# Patient Record
Sex: Male | Born: 1990
Health system: Southern US, Community
[De-identification: ages and names within clinical notes are randomized; demographics above are authoritative.]

## PROBLEM LIST (undated history)

## (undated) DIAGNOSIS — F32A Depression, unspecified: Secondary | ICD-10-CM

## (undated) HISTORY — DX: Depression, unspecified: F32.A

---

## 2016-02-13 DIAGNOSIS — C4491 Basal cell carcinoma of skin, unspecified: Secondary | ICD-10-CM

## 2016-02-13 HISTORY — DX: Basal cell carcinoma of skin, unspecified: C44.91

## 2016-02-13 HISTORY — PX: SKIN CANCER EXCISION: SHX779

## 2017-02-12 DIAGNOSIS — Z9889 Other specified postprocedural states: Secondary | ICD-10-CM

## 2017-02-12 DIAGNOSIS — K921 Melena: Secondary | ICD-10-CM

## 2017-02-12 HISTORY — DX: Melena: K92.1

## 2017-02-12 HISTORY — PX: COLONOSCOPY: SHX174

## 2017-02-12 HISTORY — PX: UPPER GI ENDOSCOPY: SHX6162

## 2017-02-12 HISTORY — DX: Other specified postprocedural states: Z98.890

## 2017-03-27 DIAGNOSIS — C44319 Basal cell carcinoma of skin of other parts of face: Secondary | ICD-10-CM | POA: Diagnosis not present

## 2017-08-01 DIAGNOSIS — L089 Local infection of the skin and subcutaneous tissue, unspecified: Secondary | ICD-10-CM | POA: Diagnosis not present

## 2017-11-29 DIAGNOSIS — K625 Hemorrhage of anus and rectum: Secondary | ICD-10-CM | POA: Diagnosis not present

## 2017-12-09 DIAGNOSIS — R1084 Generalized abdominal pain: Secondary | ICD-10-CM | POA: Diagnosis not present

## 2017-12-09 DIAGNOSIS — K921 Melena: Secondary | ICD-10-CM | POA: Diagnosis not present

## 2017-12-09 DIAGNOSIS — R197 Diarrhea, unspecified: Secondary | ICD-10-CM | POA: Diagnosis not present

## 2017-12-10 DIAGNOSIS — R109 Unspecified abdominal pain: Secondary | ICD-10-CM | POA: Diagnosis not present

## 2017-12-10 DIAGNOSIS — K921 Melena: Secondary | ICD-10-CM | POA: Diagnosis not present

## 2017-12-10 DIAGNOSIS — R197 Diarrhea, unspecified: Secondary | ICD-10-CM | POA: Diagnosis not present

## 2018-02-17 DIAGNOSIS — K921 Melena: Secondary | ICD-10-CM | POA: Diagnosis not present

## 2018-02-17 DIAGNOSIS — K648 Other hemorrhoids: Secondary | ICD-10-CM | POA: Diagnosis not present

## 2018-04-28 DIAGNOSIS — L819 Disorder of pigmentation, unspecified: Secondary | ICD-10-CM | POA: Diagnosis not present

## 2018-04-28 DIAGNOSIS — Z85828 Personal history of other malignant neoplasm of skin: Secondary | ICD-10-CM | POA: Diagnosis not present

## 2018-04-28 DIAGNOSIS — D225 Melanocytic nevi of trunk: Secondary | ICD-10-CM | POA: Diagnosis not present

## 2018-08-29 DIAGNOSIS — K219 Gastro-esophageal reflux disease without esophagitis: Secondary | ICD-10-CM | POA: Diagnosis not present

## 2018-08-29 DIAGNOSIS — R1013 Epigastric pain: Secondary | ICD-10-CM | POA: Diagnosis not present

## 2018-08-29 DIAGNOSIS — K921 Melena: Secondary | ICD-10-CM | POA: Diagnosis not present

## 2018-09-03 DIAGNOSIS — K219 Gastro-esophageal reflux disease without esophagitis: Secondary | ICD-10-CM | POA: Diagnosis not present

## 2018-09-03 DIAGNOSIS — K3189 Other diseases of stomach and duodenum: Secondary | ICD-10-CM | POA: Diagnosis not present

## 2018-12-04 DIAGNOSIS — M25511 Pain in right shoulder: Secondary | ICD-10-CM | POA: Diagnosis not present

## 2019-01-02 DIAGNOSIS — M549 Dorsalgia, unspecified: Secondary | ICD-10-CM | POA: Diagnosis not present

## 2019-01-22 ENCOUNTER — Other Ambulatory Visit: Payer: Self-pay

## 2019-01-22 ENCOUNTER — Encounter: Payer: Self-pay | Admitting: Family Medicine

## 2019-01-22 ENCOUNTER — Ambulatory Visit: Payer: BC Managed Care – PPO | Admitting: Family Medicine

## 2019-01-22 VITALS — BP 142/85 | HR 79 | Temp 98.7°F | Resp 16 | Ht 70.0 in | Wt 235.2 lb

## 2019-01-22 DIAGNOSIS — Z0000 Encounter for general adult medical examination without abnormal findings: Secondary | ICD-10-CM

## 2019-01-22 DIAGNOSIS — E669 Obesity, unspecified: Secondary | ICD-10-CM

## 2019-01-22 DIAGNOSIS — M898X1 Other specified disorders of bone, shoulder: Secondary | ICD-10-CM | POA: Diagnosis not present

## 2019-01-22 DIAGNOSIS — M25511 Pain in right shoulder: Secondary | ICD-10-CM | POA: Diagnosis not present

## 2019-01-22 NOTE — Patient Instructions (Signed)

## 2019-01-22 NOTE — Progress Notes (Signed)
Office Note 01/22/2019  CC:  Chief Complaint  Patient presents with  . Establish Care    Previous PCP, [facility]    HPI:  Adam Haynes is a 28 y.o. male who is here to establish care. Patient's most recent primary MD: [facility]. Old records were not reviewed prior to or during today's visit except vaccine records.  About 2 mo hx of pain in R shoulder blade, sometimes achiness across upper back.  Some tenderness. UC visits x 2--->musculoskeletal dx.  He was rx meloxicam and it helped a little  Ibup prior to that helped less. NO radiation of the pain up or down or into arms.  Some neck stiffness initially. He is improved but still has some mild shoulder tenderness, occ ache in R upper back. No abd pain, no nausea, no fevers.    Past Medical History:  Diagnosis Date  . Basal cell carcinoma 2018   R zygomatic region of face--Mohs  . Blood in stool 2019   Colonoscopy->int hermorrhoids  . History of esophagogastroduodenoscopy (EGD) 2019   for ?melena but he had recently taken pepto-->all normal.    Past Surgical History:  Procedure Laterality Date  . COLONOSCOPY  2019  . SKIN CANCER EXCISION  2018   BCC face  . UPPER GI ENDOSCOPY  2019    Family History  Problem Relation Age of Onset  . High blood pressure Father   . Heart disease Maternal Grandfather   . Melanoma Paternal Grandmother   . Dementia Paternal Grandfather   . Parkinson's disease Paternal Grandfather     Social History   Socioeconomic History  . Marital status: Single    Spouse name: Not on file  . Number of children: Not on file  . Years of education: Not on file  . Highest education level: Bachelor's degree (e.g., BA, AB, BS)  Occupational History  . Occupation: Document Preservationist  Tobacco Use  . Smoking status: Never Smoker  . Smokeless tobacco: Never Used  Substance and Sexual Activity  . Alcohol use: Yes    Alcohol/week: 1.0 - 2.0 standard drinks    Types: 1 - 2 Cans of beer  per week  . Drug use: Never  . Sexual activity: Not on file  Other Topics Concern  . Not on file  Social History Narrative   Single.  Orig from [place], now in [place].   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   Social Determinants of Health   Financial Resource Strain:   . Difficulty of Paying Living Expenses: Not on file  Food Insecurity:   . Worried About Charity fundraiser in the Last Year: Not on file  . Ran Out of Food in the Last Year: Not on file  Transportation Needs:   . Lack of Transportation (Medical): Not on file  . Lack of Transportation (Non-Medical): Not on file  Physical Activity:   . Days of Exercise per Week: Not on file  . Minutes of Exercise per Session: Not on file  Stress:   . Feeling of Stress : Not on file  Social Connections:   . Frequency of Communication with Friends and Family: Not on file  . Frequency of Social Gatherings with Friends and Family: Not on file  . Attends Religious Services: Not on file  . Active Member of Clubs or Organizations: Not on file  . Attends Archivist Meetings: Not on file  . Marital  Status: Not on file  Intimate Partner Violence:   . Fear of Current or Ex-Partner: Not on file  . Emotionally Abused: Not on file  . Physically Abused: Not on file  . Sexually Abused: Not on file    Outpatient Encounter Medications as of 01/22/2019  Medication Sig  . Omega-3 Fatty Acids (FISH OIL PO) Take by mouth every other day.   No facility-administered encounter medications on file as of 01/22/2019.    No Known Allergies  ROS Review of Systems  Constitutional: Negative for appetite change, chills, fatigue and fever.  HENT: Negative for congestion, dental problem, ear pain and sore throat.   Eyes: Negative for discharge, redness and visual disturbance.  Respiratory: Negative for cough, chest tightness, shortness of breath and wheezing.   Cardiovascular:  Negative for chest pain, palpitations and leg swelling.  Gastrointestinal: Negative for abdominal pain, blood in stool, diarrhea, nausea and vomiting.  Genitourinary: Negative for difficulty urinating, dysuria, flank pain, frequency, hematuria and urgency.  Musculoskeletal: Positive for back pain (see hpi). Negative for arthralgias, joint swelling, myalgias and neck stiffness.  Skin: Negative for pallor and rash.  Neurological: Negative for dizziness, speech difficulty, weakness and headaches.  Hematological: Negative for adenopathy. Does not bruise/bleed easily.  Psychiatric/Behavioral: Negative for confusion and sleep disturbance. The patient is not nervous/anxious.     PE; Blood pressure (!) 142/85, pulse 79, temperature 98.7 F (37.1 C), temperature source Temporal, resp. rate 16, height 5\' 10"  (1.778 m), weight 235 lb 3.2 oz (106.7 kg), SpO2 97 %. Body mass index is 33.75 kg/m.  Gen: Alert, well appearing.  Patient is oriented to person, place, time, and situation. AFFECT: pleasant, lucid thought and speech. ENT: Ears: EACs clear, normal epithelium.  TMs with good light reflex and landmarks bilaterally.  Eyes: no injection, icteris, swelling, or exudate.  EOMI, PERRLA. Nose: no drainage or turbinate edema/swelling.  No injection or focal lesion.  Mouth: lips without lesion/swelling.  Oral mucosa pink and moist.  Dentition intact and without obvious caries or gingival swelling.  Oropharynx without erythema, exudate, or swelling.  Neck: supple/nontender.  ROM fully intact w/out pain or stiffness. No LAD, mass, or TM.  Carotid pulses 2+ bilaterally, without bruits. CV: RRR, no m/r/g.   LUNGS: CTA bilat, nonlabored resps, good aeration in all lung fields. ABD: soft, NT, ND, BS normal.  No hepatospenomegaly or mass.  No bruits. EXT: no clubbing, cyanosis, or edema.  Musculoskeletal: no joint swelling, erythema, warmth, or tenderness.  ROM of all joints intact. Skin - no sores or suspicious  lesions or rashes or color changes Back: no deformity. He has mild focal TTP in 4 separate "trigger point" areas medial and inferior to R scapula.  Pertinent labs:  none  ASSESSMENT AND PLAN:   New pt;  1) R scapula pain, subacute.  Gradually improving of late. He does have myofascial trigger points.  Continue with obs but if pain returns again then he'll return for trigger point injections. Recommended heat, ibup q8h prn with food. Stretch, light workouts.  2) Health maintenance exam: Reviewed age and gender appropriate health maintenance issues (prudent diet, regular exercise, health risks of tobacco and excessive alcohol, use of seatbelts, fire alarms in home, use of sunscreen).  Also reviewed age and gender appropriate health screening as well as vaccine recommendations. Vaccines: reviewed->all UTD.  Flu vaccine: gets this via employer. Labs: CMET and FLP today.  An After Visit Summary was printed and given to the patient.  Return in about 1  year (around 01/22/2020) for annual CPE (fasting): also needs fasting lab appt at his earliest convenience.  Signed:  Crissie Sickles, MD           01/22/2019

## 2019-01-29 ENCOUNTER — Other Ambulatory Visit: Payer: Self-pay

## 2019-01-29 ENCOUNTER — Ambulatory Visit (INDEPENDENT_AMBULATORY_CARE_PROVIDER_SITE_OTHER): Payer: BC Managed Care – PPO | Admitting: Family Medicine

## 2019-01-29 DIAGNOSIS — E669 Obesity, unspecified: Secondary | ICD-10-CM

## 2019-01-29 LAB — COMPREHENSIVE METABOLIC PANEL
ALT: 52 U/L (ref 0–53)
AST: 29 U/L (ref 0–37)
Albumin: 4.8 g/dL (ref 3.5–5.2)
Alkaline Phosphatase: 49 U/L (ref 39–117)
BUN: 13 mg/dL (ref 6–23)
CO2: 30 mEq/L (ref 19–32)
Calcium: 9.7 mg/dL (ref 8.4–10.5)
Chloride: 101 mEq/L (ref 96–112)
Creatinine, Ser: 0.94 mg/dL (ref 0.40–1.50)
GFR: 96.86 mL/min (ref >60.00)
Glucose, Bld: 90 mg/dL (ref 70–99)
Potassium: 4.6 mEq/L (ref 3.5–5.1)
Sodium: 138 mEq/L (ref 135–145)
Total Bilirubin: 0.7 mg/dL (ref 0.2–1.2)
Total Protein: 7.5 g/dL (ref 6.0–8.3)

## 2019-01-29 LAB — LIPID PANEL
Cholesterol: 174 mg/dL (ref 0–200)
HDL: 39 mg/dL — ABNORMAL LOW (ref >39.00)
NonHDL: 135.43
Total CHOL/HDL Ratio: 4
Triglycerides: 322 mg/dL — ABNORMAL HIGH (ref 0.0–149.0)
VLDL: 64.4 mg/dL — ABNORMAL HIGH (ref 0.0–40.0)

## 2019-01-29 LAB — LDL CHOLESTEROL, DIRECT: Direct LDL: 76 mg/dL

## 2019-02-03 ENCOUNTER — Other Ambulatory Visit: Payer: Self-pay

## 2019-02-03 ENCOUNTER — Ambulatory Visit: Payer: BC Managed Care – PPO | Admitting: Family Medicine

## 2019-02-03 ENCOUNTER — Encounter: Payer: Self-pay | Admitting: Family Medicine

## 2019-02-03 VITALS — BP 135/84 | HR 73 | Temp 98.7°F | Resp 16 | Ht 70.0 in | Wt 237.4 lb

## 2019-02-03 DIAGNOSIS — S83001A Unspecified subluxation of right patella, initial encounter: Secondary | ICD-10-CM

## 2019-02-03 NOTE — Progress Notes (Signed)
OFFICE VISIT  02/03/2019   CC:  Chief Complaint  Patient presents with  . Knee Pain    right, x injury 7 days ago   HPI:    Patient is a 28 y.o.  male who presents for knee discomfort and stiffness.   Onset about 7 d/a after getting up from table.  Felt like it did not move like normal, like something went "out of place" briefly. No twist, pop, or pain.   It buckled once.  No swelling.  Initially some discomfort with walking but now wt bearing and walking are fine. Discomfort in area just inf to patella and lateral area of distal thigh, lateral aspect of knee, and lateral part of prox calf. Better last couple days.  No aching or pain in knees prior. No hx of prolonged immobility.  No recent surgical procedure.  No FH of thrombosis.  Past Medical History:  Diagnosis Date  . Basal cell carcinoma 2018   R zygomatic region of face--Mohs  . Blood in stool 2019   Colonoscopy->int hermorrhoids  . History of esophagogastroduodenoscopy (EGD) 2019   for ?melena but he had recently taken pepto-->all normal.    Past Surgical History:  Procedure Laterality Date  . COLONOSCOPY  2019  . SKIN CANCER EXCISION  2018   BCC face  . UPPER GI ENDOSCOPY  2019    Outpatient Medications Prior to Visit  Medication Sig Dispense Refill  . Omega-3 Fatty Acids (FISH OIL PO) Take by mouth every other day.     No facility-administered medications prior to visit.    No Known Allergies  ROS As per HPI  PE: Blood pressure 135/84, pulse 73, temperature 98.7 F (37.1 C), temperature source Temporal, resp. rate 16, height 5\' 10"  ([id number] m), weight 237 lb 6.4 oz (107.7 kg), SpO2 99 %. Gen: Alert, well appearing.  Patient is oriented to person, place, time, and situation. AFFECT: pleasant, lucid thought and speech. R knee w/out deformity, erythema, warmth, or tenderness.  No swelling of knee. Movement of patella medially and laterally shows a bit of increased laxity and mild discomfort on left, but  there is no pain and patellar grind is neg.  Flexion and extension intact w/out pain.  No instability. No pop/click at the IT band region.  Lat collat lig's w/out tenderness or instability. No edema.  No popliteal nodule/mass/tenderness.  No calf asymmetry.  LABS:    Chemistry      Component Value Date/Time   NA 138 01/29/2019 0903   K 4.6 01/29/2019 0903   CL 101 01/29/2019 0903   CO2 30 01/29/2019 0903   BUN 13 01/29/2019 0903   CREATININE 0.94 01/29/2019 0903      Component Value Date/Time   CALCIUM 9.7 01/29/2019 0903   ALKPHOS 49 01/29/2019 0903   AST 29 01/29/2019 0903   ALT 52 01/29/2019 0903   BILITOT 0.7 01/29/2019 0903      IMPRESSION AND PLAN:  Right patellar subluxation, with a bit of residual discomfort/instability. Discussed dx, reassured him. Discussed some strengthening/stretching exercises he can do to minimize chance of recurrence. OK to wear soft knee sleeve for now until it feels back to normal. Signs/symptoms to call or return for were reviewed and pt expressed understanding.  An After Visit Summary was printed and given to the patient.  FOLLOW UP: No follow-ups on file.  Signed:  Crissie Sickles, MD           02/03/2019

## 2019-04-28 DIAGNOSIS — L110 Acquired keratosis follicularis: Secondary | ICD-10-CM | POA: Diagnosis not present

## 2019-04-28 DIAGNOSIS — L819 Disorder of pigmentation, unspecified: Secondary | ICD-10-CM | POA: Diagnosis not present

## 2019-04-28 DIAGNOSIS — L905 Scar conditions and fibrosis of skin: Secondary | ICD-10-CM | POA: Diagnosis not present

## 2019-04-28 DIAGNOSIS — D1801 Hemangioma of skin and subcutaneous tissue: Secondary | ICD-10-CM | POA: Diagnosis not present

## 2019-07-15 ENCOUNTER — Ambulatory Visit: Payer: BC Managed Care – PPO | Admitting: Family Medicine

## 2019-07-15 ENCOUNTER — Other Ambulatory Visit: Payer: Self-pay

## 2019-07-15 ENCOUNTER — Encounter: Payer: Self-pay | Admitting: Family Medicine

## 2019-07-15 VITALS — BP 133/87 | HR 71 | Temp 98.6°F | Resp 16 | Ht 70.0 in | Wt 236.8 lb

## 2019-07-15 DIAGNOSIS — R590 Localized enlarged lymph nodes: Secondary | ICD-10-CM

## 2019-07-15 NOTE — Progress Notes (Signed)
OFFICE VISIT  07/15/2019   CC:  Chief Complaint  Patient presents with  . Possible swollen lymph node    located on right side of neck, first noticed 3 weeks ago   HPI:    Patient is a 29 y.o. Caucasian male who presents for swelling on R side of neck a few cm behind ear. No pain or itching, no f/c/malaise.  Has dandruff but nothing worse than his usual. No hx of similar.  No recent scalp lesion or bite in the region of head/neck.   Past Medical History:  Diagnosis Date  . Basal cell carcinoma 2018   R zygomatic region of face--Mohs  . Blood in stool 2019   Colonoscopy->int hermorrhoids  . History of esophagogastroduodenoscopy (EGD) 2019   for ?melena but he had recently taken pepto-->all normal.    Past Surgical History:  Procedure Laterality Date  . COLONOSCOPY  2019  . SKIN CANCER EXCISION  2018   BCC face  . UPPER GI ENDOSCOPY  2019    Outpatient Medications Prior to Visit  Medication Sig Dispense Refill  . Omega-3 Fatty Acids (FISH OIL PO) Take by mouth every other day.     No facility-administered medications prior to visit.    No Known Allergies  ROS As per HPI  PE: Blood pressure 133/87, pulse 71, temperature 98.6 F (37 C), temperature source Temporal, resp. rate 16, height 5\' 10"  (1.778 m), weight 236 lb 12.8 oz (107.4 kg), SpO2 98 %. Gen: Alert, well appearing.  Patient is oriented to person, place, time, and situation. ENT: Ears: Eyes: no injection, icteris, swelling, or exudate.  EOMI, PERRLA. Nose: no drainage or turbinate edema/swelling.  No injection or focal lesion.  Mouth: lips without lesion/swelling.  Oral mucosa pink and moist.  Dentition intact and without obvious caries or gingival swelling.  Oropharynx without erythema, exudate, or swelling.  Scalp and hair normal.   He has a soft, 1 cm sub Q nodular lesion that is mobile, located at base of skull about 5 cm directly posterior to the angle of R mandible.  No overlying skin changes.  No  tenderness.   No other masses/nodes palpable.  LABS:  none  IMPRESSION AND PLAN:  R base of skull soft tissue nodule, small, feels benign, likely a LN. Discussed option of ultrasound of the area now vs watchful waiting for spontaneous resolution and recheck in office in 1 mo---u/s at that time if not resolved.  He chose watchful waiting. Signs/symptoms to call or return for were reviewed and pt expressed understanding.  An After Visit Summary was printed and given to the patient.  FOLLOW UP: Return in about 4 weeks (around 08/12/2019) for f/u R occip LN.  Signed:  Crissie Sickles, MD           07/15/2019

## 2019-07-21 DIAGNOSIS — H5213 Myopia, bilateral: Secondary | ICD-10-CM | POA: Insufficient documentation

## 2019-07-21 DIAGNOSIS — Z83511 Family history of glaucoma: Secondary | ICD-10-CM | POA: Diagnosis not present

## 2019-07-21 DIAGNOSIS — H43393 Other vitreous opacities, bilateral: Secondary | ICD-10-CM | POA: Diagnosis not present

## 2019-08-21 ENCOUNTER — Other Ambulatory Visit (HOSPITAL_COMMUNITY)
Admission: RE | Admit: 2019-08-21 | Discharge: 2019-08-21 | Disposition: A | Discharge status: Home / Self Care | Payer: BC Managed Care – PPO | Source: Ambulatory Visit | Attending: Family Medicine | Admitting: Family Medicine

## 2019-08-21 ENCOUNTER — Encounter: Payer: Self-pay | Admitting: Family Medicine

## 2019-08-21 ENCOUNTER — Ambulatory Visit (INDEPENDENT_AMBULATORY_CARE_PROVIDER_SITE_OTHER): Payer: BC Managed Care – PPO | Admitting: Family Medicine

## 2019-08-21 ENCOUNTER — Other Ambulatory Visit: Payer: Self-pay

## 2019-08-21 VITALS — BP 145/84 | HR 52 | Temp 98.7°F | Resp 17 | Ht 70.0 in | Wt 233.2 lb

## 2019-08-21 DIAGNOSIS — Z7251 High risk heterosexual behavior: Secondary | ICD-10-CM

## 2019-08-21 DIAGNOSIS — R350 Frequency of micturition: Secondary | ICD-10-CM

## 2019-08-21 DIAGNOSIS — R102 Pelvic and perineal pain: Secondary | ICD-10-CM

## 2019-08-21 DIAGNOSIS — R221 Localized swelling, mass and lump, neck: Secondary | ICD-10-CM | POA: Diagnosis not present

## 2019-08-21 DIAGNOSIS — F419 Anxiety disorder, unspecified: Secondary | ICD-10-CM

## 2019-08-21 LAB — POCT URINALYSIS DIPSTICK
Bilirubin, UA: NEGATIVE
Blood, UA: NEGATIVE
Glucose, UA: NEGATIVE
Ketones, UA: NEGATIVE
Leukocytes, UA: NEGATIVE
Nitrite, UA: NEGATIVE
Protein, UA: NEGATIVE
Spec Grav, UA: 1.015
Urobilinogen, UA: 0.2 U/dL
pH, UA: 6.5

## 2019-08-21 NOTE — Progress Notes (Signed)
OFFICE VISIT  08/21/2019   CC:  Chief Complaint  Patient presents with  . Follow-up    Pt has had burning with urination with pain under rib cage.    HPI:    Patient is a 29 y.o. Caucasian male who presents for 5 week f/u soft tissue nodule over the base of skull on R side.   A/P as of last visit: "R base of skull soft tissue nodule, small, feels benign, likely a LN. Discussed option of ultrasound of the area now vs watchful waiting for spontaneous resolution and recheck in office in 1 mo---u/s at that time if not resolved.  He chose watchful waiting."  INTERIM HX: Feeling nervous.  Says the nodule does not feel nearly as pronounced it was. A few days ago felt any achiness in generalized chest area and under ribs. Mild, constant, like muscle achiness per pt, gone today. No SOB, diaphoresis.  Recently (onset about a week ago) has had some intermittent mild discomfort in lower midline abd/suprapubic region when urinating.  Otherwise no discomfort.   No burning with urination.  No straining.  No blood in urine.  Some increased urgency/frequency but he admits he drinks a lot of fluids. Has had unprotected sex with a girl in the last 2 wks.  He has no hx of STD. She has not complained of any symptoms.  Pt states the male partner had a monogamous boyfriend for 2 yrs prior. Pt can't be sure but thinks he may have seen something in his urine on one occasion that resembled pus.  He admits he's nervous and may be hyper-focusing on things physically b/c of the nodule on neck lately.    ROS: no fevers,no SOB, no wheezing, no cough, no dizziness, no HAs, no rashes, no melena/hematochezia. No polydipsia.  No arthralgias.  No focal weakness, paresthesias, or tremors.  No acute vision or hearing abnormalities. No n/v/d. No palpitations.      Past Medical History:  Diagnosis Date  . Basal cell carcinoma 2018   R zygomatic region of face--Mohs  . Blood in stool 2019   Colonoscopy->int  hermorrhoids  . History of esophagogastroduodenoscopy (EGD) 2019   for ?melena but he had recently taken pepto-->all normal.    Past Surgical History:  Procedure Laterality Date  . COLONOSCOPY  2019  . SKIN CANCER EXCISION  2018   BCC face  . UPPER GI ENDOSCOPY  2019    Outpatient Medications Prior to Visit  Medication Sig Dispense Refill  . Omega-3 Fatty Acids (FISH OIL PO) Take by mouth every other day.     No facility-administered medications prior to visit.    No Known Allergies  ROS As per HPI  PE: Blood pressure (!) 145/84, pulse (!) 52, temperature 98.7 F (37.1 C), temperature source Temporal, resp. rate 17, height 5\' 10"  (1.778 m), weight 233 lb 4 oz (105.8 kg), SpO2 100 %. Gen: Alert, well appearing.  Patient is oriented to person, place, time, and situation. AFFECT: pleasant, lucid thought and speech. R occipital region NORMAL.  I cannot feel any nodule whatsoever. Remainder of neck also w/out abnormality. CWU:GQBV: no injection, icteris, swelling, or exudate.  EOMI, PERRLA. Mouth: lips without lesion/swelling.  Oral mucosa pink and moist. Oropharynx without erythema, exudate, or swelling.  CV: RRR, no m/r/g.   LUNGS: CTA bilat, nonlabored resps, good aeration in all lung fields. No chest wall TTP ABD: soft/ ND/NT   LABS:  CC UA today: normal   IMPRESSION AND PLAN:  1) R occipital nodule: NOTHING there now. Reassured pt.  2) Chest wall pain: resolved.  Reassured, suspect muscular tension.  3) Suprapubic discomfort, vague urinary complaints, recent unprotected sex. I did obtain a "dirty" urine sample for GC/Chlamydia testing today but chose NOT to empirically treat.    4) Anxiety: He is definitely hyper-focused on himself physiologically, admits this himself. Suspect he has a somatization component to lots of anxiety.  An After Visit Summary was printed and given to the patient.  FOLLOW UP: Return if symptoms worsen or fail to improve.  Signed:   Crissie Sickles, MD           08/21/2019

## 2019-08-25 LAB — URINE CYTOLOGY ANCILLARY ONLY
Chlamydia: NEGATIVE
Comment: NEGATIVE
Comment: NORMAL
Neisseria Gonorrhea: NEGATIVE

## 2019-09-10 DIAGNOSIS — R599 Enlarged lymph nodes, unspecified: Secondary | ICD-10-CM | POA: Diagnosis not present

## 2019-10-27 ENCOUNTER — Telehealth: Payer: Self-pay

## 2019-10-27 DIAGNOSIS — R221 Localized swelling, mass and lump, neck: Secondary | ICD-10-CM

## 2019-10-27 NOTE — Telephone Encounter (Signed)
Patient would like to proceed with recommended [place] of neck mentioned in office visit. He would like to go to the [employer].

## 2019-10-27 NOTE — Telephone Encounter (Signed)
Please advise 

## 2019-10-27 NOTE — Telephone Encounter (Signed)
OK-ordered.

## 2019-10-29 ENCOUNTER — Other Ambulatory Visit: Payer: Self-pay

## 2019-10-29 ENCOUNTER — Ambulatory Visit (INDEPENDENT_AMBULATORY_CARE_PROVIDER_SITE_OTHER): Payer: BC Managed Care – PPO

## 2019-10-29 DIAGNOSIS — R221 Localized swelling, mass and lump, neck: Secondary | ICD-10-CM

## 2019-10-29 NOTE — Telephone Encounter (Signed)
Patient scheduled at [facility] 10/29/19

## 2020-04-27 DIAGNOSIS — Z85828 Personal history of other malignant neoplasm of skin: Secondary | ICD-10-CM | POA: Diagnosis not present

## 2020-04-27 DIAGNOSIS — D229 Melanocytic nevi, unspecified: Secondary | ICD-10-CM | POA: Diagnosis not present

## 2020-04-27 DIAGNOSIS — L905 Scar conditions and fibrosis of skin: Secondary | ICD-10-CM | POA: Diagnosis not present

## 2020-04-27 DIAGNOSIS — L814 Other melanin hyperpigmentation: Secondary | ICD-10-CM | POA: Diagnosis not present

## 2020-05-09 DIAGNOSIS — M791 Myalgia, unspecified site: Secondary | ICD-10-CM | POA: Diagnosis not present

## 2020-05-09 DIAGNOSIS — J069 Acute upper respiratory infection, unspecified: Secondary | ICD-10-CM | POA: Diagnosis not present

## 2020-05-09 DIAGNOSIS — Z20822 Contact with and (suspected) exposure to covid-19: Secondary | ICD-10-CM | POA: Diagnosis not present

## 2020-08-18 ENCOUNTER — Emergency Department
Admission: EM | Admit: 2020-08-18 | Discharge: 2020-08-18 | Disposition: A | Discharge status: Home / Self Care | Payer: BC Managed Care – PPO | Source: Home / Self Care | Attending: Family Medicine | Admitting: Family Medicine

## 2020-08-18 ENCOUNTER — Other Ambulatory Visit: Payer: Self-pay

## 2020-08-18 DIAGNOSIS — U071 COVID-19: Secondary | ICD-10-CM | POA: Diagnosis not present

## 2020-08-18 DIAGNOSIS — S83001S Unspecified subluxation of right patella, sequela: Secondary | ICD-10-CM

## 2020-08-18 LAB — POC SARS CORONAVIRUS 2 AG -  ED: SARS Coronavirus 2 Ag: POSITIVE — AB

## 2020-08-18 NOTE — Discharge Instructions (Addendum)
I have included COVID information Stay home, call if you have questions or problems  I have included information about patellar subluxation.  I will give you the name of the sports medicine doctor recommended.

## 2020-08-18 NOTE — ED Triage Notes (Signed)
Pt presents to Urgent Care with c/o cough, nasal congestion, and body aches x 2 days. Has not taken a COVID test; pt is vaccinated. Pt also wants his R knee evaluated d/t intermittent pain and "popping" x approx 1 year. He is wearing a knee brace.

## 2020-08-18 NOTE — ED Provider Notes (Signed)
[facility]    CSN: [id number] Arrival date & time: 08/18/20  1340      History   Chief Complaint Chief Complaint  Patient presents with   Cough   Nasal Congestion   Knee Pain    Right    HPI Adam Haynes is a 30 y.o. male.   HPI  Patient has had chills.  Body aches.  Nasal congestion and cough.  Its been going on for 2 to 3 days.  No known exposure to COVID.  He has not COVID vaccinated.  No shortness of breath.  Patient also has right knee pain.  This is chronic.  Its been going on for a year.  It started with a patellar subluxation.  He has mentioned it to his primary care doctor, but he tells patient to give it time.  He takes occasional Advil.  Has been wearing a brace for a year.  He has tried to do some exercises.  He wants to know how to get rid of his knee pain  Past Medical History:  Diagnosis Date   Basal cell carcinoma 2018   R zygomatic region of face--Mohs   Blood in stool 2019   Colonoscopy->int hermorrhoids   History of esophagogastroduodenoscopy (EGD) 2019   for ?melena but he had recently taken pepto-->all normal.    Patient Active Problem List   Diagnosis Date Noted   Myopia of both eyes 07/21/2019   Vitreous floater, bilateral 07/21/2019    Past Surgical History:  Procedure Laterality Date   COLONOSCOPY  2019   SKIN CANCER EXCISION  2018   [place] face   UPPER GI ENDOSCOPY  2019       Home Medications    Prior to Admission medications   Medication Sig Start Date End Date Taking? Authorizing Provider  pseudoephedrine (SUDAFED) 30 MG tablet Take 30 mg by mouth every 4 (four) hours as needed for congestion.   Yes [provider]  Omega-3 Fatty Acids (FISH OIL PO) Take by mouth every other day.    [provider]    Family History Family History  Problem Relation Age of Onset   Healthy Mother    High blood pressure Father    Heart disease Maternal Grandfather    Melanoma Paternal Grandmother    Dementia  Paternal Grandfather    Parkinson's disease Paternal Grandfather     Social History Social History   Tobacco Use   Smoking status: Never   Smokeless tobacco: Never  Vaping Use   Vaping Use: Never used  Substance Use Topics   Alcohol use: Yes    Alcohol/week: 2.0 - 3.0 standard drinks    Types: 1 - 2 Cans of beer, 1 Standard drinks or equivalent per week   Drug use: Not Currently     Allergies   Patient has no known allergies.   Review of Systems Review of Systems  See HPI Physical Exam Triage Vital Signs ED Triage Vitals  Enc Vitals Group     BP 08/18/20 1401 122/82     Pulse Rate 08/18/20 1401 (!) 110     Resp 08/18/20 1401 20     Temp 08/18/20 1401 98.7 F (37.1 C)     Temp Source 08/18/20 1401 Oral     SpO2 08/18/20 1401 98 %     Weight 08/18/20 1358 234 lb (106.1 kg)     Height 08/18/20 1358 5\' 10"  (1.778 m)     Head Circumference --  Peak Flow --      Pain Score 08/18/20 1358 1     Pain Loc --      Pain Edu? --      Excl. in [place]? --    No data found.  Updated Vital Signs BP 122/82 (BP Location: Right Arm)   Pulse (!) 110   Temp 98.7 F (37.1 C) (Oral)   Resp 20   Ht 5\' 10"  (1.778 m)   Wt 106.1 kg   SpO2 98%   BMI 33.58 kg/m       Physical Exam Constitutional:      General: He is not in acute distress.    Appearance: He is well-developed and normal weight.  HENT:     Head: Normocephalic and atraumatic.     Right Ear: Tympanic membrane and ear canal normal.     Left Ear: Tympanic membrane and ear canal normal.     Nose: Congestion present.     Mouth/Throat:     Pharynx: Posterior oropharyngeal erythema present.  Eyes:     Conjunctiva/sclera: Conjunctivae normal.     Pupils: Pupils are equal, round, and reactive to light.  Cardiovascular:     Rate and Rhythm: Tachycardia present.     Heart sounds: Normal heart sounds.  Pulmonary:     Effort: Pulmonary effort is normal. No respiratory distress.     Breath sounds: Normal breath  sounds.  Abdominal:     General: There is no distension.     Palpations: Abdomen is soft.  Musculoskeletal:        General: Normal range of motion.     Cervical back: Normal range of motion.     Comments: Right knee in brace.  Skin:    General: Skin is warm and dry.  Neurological:     Mental Status: He is alert.  Psychiatric:        Mood and Affect: Mood normal.        Behavior: Behavior normal.     UC Treatments / Results  Labs (all labs ordered are listed, but only abnormal results are displayed) Labs Reviewed  POC SARS CORONAVIRUS 2 AG -  ED - Abnormal; Notable for the following components:      Result Value   SARS Coronavirus 2 Ag Positive (*)    All other components within normal limits    EKG   Radiology No results found.  Procedures Procedures (including critical care time)  Medications Ordered in UC Medications - No data to display  Initial Impression / Assessment and Plan / UC Course  I have reviewed the triage vital signs and the nursing notes.  Pertinent labs & imaging results that were available during my care of the patient were reviewed by me and considered in my medical decision making (see chart for details).     Discussed home treatment for the COVID.  Information given. Refer to sports medicine for 1 year of knee pain. Final Clinical Impressions(s) / UC Diagnoses   Final diagnoses:  COVID-19  Patellar subluxation, right, sequela     Discharge Instructions      I have included COVID information Stay home, call if you have questions or problems  I have included information about patellar subluxation.  I will give you the name of the sports medicine doctor recommended.   ED Prescriptions   None    PDMP not reviewed this encounter.   Raylene Everts, MD 08/18/20 [phone]

## 2020-10-19 ENCOUNTER — Ambulatory Visit (INDEPENDENT_AMBULATORY_CARE_PROVIDER_SITE_OTHER): Payer: BC Managed Care – PPO | Admitting: Sports Medicine

## 2020-10-19 ENCOUNTER — Other Ambulatory Visit: Payer: Self-pay

## 2020-10-19 ENCOUNTER — Encounter: Payer: Self-pay | Admitting: Sports Medicine

## 2020-10-19 ENCOUNTER — Ambulatory Visit (INDEPENDENT_AMBULATORY_CARE_PROVIDER_SITE_OTHER): Payer: BC Managed Care – PPO

## 2020-10-19 DIAGNOSIS — M222X1 Patellofemoral disorders, right knee: Secondary | ICD-10-CM | POA: Insufficient documentation

## 2020-10-19 DIAGNOSIS — M25562 Pain in left knee: Secondary | ICD-10-CM | POA: Diagnosis not present

## 2020-10-19 DIAGNOSIS — Z09 Encounter for follow-up examination after completed treatment for conditions other than malignant neoplasm: Secondary | ICD-10-CM

## 2020-10-19 DIAGNOSIS — M25761 Osteophyte, right knee: Secondary | ICD-10-CM | POA: Diagnosis not present

## 2020-10-19 MED ORDER — NAPROXEN 500 MG PO TABS: 500.0000 mg | ORAL_TABLET | Freq: Two times a day (BID) | ORAL | 3 refills | Status: AC

## 2020-10-19 NOTE — Progress Notes (Signed)
    Procedures performed today:    None.  Independent interpretation of notes and tests performed by another provider:   None.  Brief History, Exam, Impression, and Recommendations:    Patellofemoral syndrome, right This is a very pleasant 30 year old male, he has a history of right-sided knee pain suspected to be patellar subluxation. This occurred about a year ago, since then he is done okay, he has pain under the patella worse with walking, weightbearing. He denies any patellar instability. On exam he has no effusion, only minimal patellar crepitus, minimally positive painful patellar compression, pain under the patellar facets. We discussed the anatomy and pathophysiology, he will work aggressively in physical therapy, we will add naproxen, x-rays, return to see me in 6 weeks, MRI if no better.    ___________________________________________ Gwen Her. Dianah Field, M.D., ABFM., CAQSM. Primary Care and [place] Instructor of [place] [employer] [facility] [employer]

## 2020-10-19 NOTE — Assessment & Plan Note (Signed)
This is a very pleasant 30 year old male, he has a history of right-sided knee pain suspected to be patellar subluxation. This occurred about a year ago, since then he is done okay, he has pain under the patella worse with walking, weightbearing. He denies any patellar instability. On exam he has no effusion, only minimal patellar crepitus, minimally positive painful patellar compression, pain under the patellar facets. We discussed the anatomy and pathophysiology, he will work aggressively in physical therapy, we will add naproxen, x-rays, return to see me in 6 weeks, MRI if no better.

## 2020-10-28 ENCOUNTER — Ambulatory Visit (INDEPENDENT_AMBULATORY_CARE_PROVIDER_SITE_OTHER): Payer: BC Managed Care – PPO | Admitting: Physical Therapy

## 2020-10-28 ENCOUNTER — Other Ambulatory Visit: Payer: Self-pay

## 2020-10-28 ENCOUNTER — Encounter: Payer: Self-pay | Admitting: Physical Therapy

## 2020-10-28 DIAGNOSIS — R29898 Other symptoms and signs involving the musculoskeletal system: Secondary | ICD-10-CM

## 2020-10-28 DIAGNOSIS — M25561 Pain in right knee: Secondary | ICD-10-CM | POA: Diagnosis not present

## 2020-10-28 DIAGNOSIS — G8929 Other chronic pain: Secondary | ICD-10-CM | POA: Diagnosis not present

## 2020-10-28 DIAGNOSIS — M6281 Muscle weakness (generalized): Secondary | ICD-10-CM | POA: Diagnosis not present

## 2020-10-28 NOTE — Therapy (Signed)
[place], [place], [address] Phone: [phone]   Fax:  [phone]  Physical Therapy Evaluation  Patient Details  Name: Adam Haynes MRN: [id number] Date of Birth: 1990/06/16 Referring Provider (PT): Thekkekandam   Encounter Date: 10/28/2020   PT End of Session - 10/28/20 1014     Visit Number 1    Number of Visits 6    Date for PT Re-Evaluation 12/08/20    PT Start Time 0930    PT Stop Time 1005    PT Time Calculation (min) 35 min    Activity Tolerance Patient tolerated treatment well    Behavior During Therapy [facility] for tasks assessed/performed             Past Medical History:  Diagnosis Date   Basal cell carcinoma 2018   R zygomatic region of face--Mohs   Blood in stool 2019   Colonoscopy->int hermorrhoids   Depression    History of esophagogastroduodenoscopy (EGD) 2019   for ?melena but he had recently taken pepto-->all normal.    Past Surgical History:  Procedure Laterality Date   COLONOSCOPY  2019   SKIN CANCER EXCISION  2018   [place] face   UPPER GI ENDOSCOPY  2019    There were no vitals filed for this visit.    Subjective Assessment - 10/28/20 0933     Subjective Pt had insiduous onset of Rt patella subluxation about 1 year ago and has managed conservatively with a brace. He returned to MD due to Rt knee feeling "off" and MD recommends PT. Pt reports Rt knee feels "uncomfortable" with prolonged standing, running, symptoms ease with rest and stretching. Pt wears brace at all times other than sleep.    How long can you stand comfortably? 6 hours    How long can you walk comfortably? 30 mins to 1 hour hiking    Diagnostic tests x ray shows OA    Patient Stated Goals be able to run and hike with decreased symptoms    Currently in Pain? No/denies                [facility] PT Assessment - 10/28/20 0001       Assessment   Medical Diagnosis Rt patellofemoral syndrome     Referring Provider (PT) Thekkekandam    Onset Date/Surgical Date 10/30/19    Prior Therapy none      Precautions   Precautions None      Balance Screen   Has the patient fallen in the past 6 months No      Prior Function   Level of Independence Independent    Vocation Requirements book preservationist, requires lots of walking and standing    Leisure run, hike      Observation/Other Assessments   Focus on Therapeutic Outcomes (FOTO)  77      Functional Tests   Functional tests Squat;Single leg stance;Step up;Step down      Squat   Comments genu varum on Rt with full squat      Step Up   Comments no difficulty      Step Down   Comments symptoms with eccentric lowering during lateral and forward step down      Single Leg Stance   Comments Rt 15 sec, Lt 30 sec      ROM / Strength   AROM / PROM / Strength AROM;Strength      AROM   Overall AROM Comments bilat knee ROM [facility]  Strength   Strength Assessment Site Hip;Knee    Right/Left Hip Right;Left    Right Hip Flexion 4+/5    Right Hip Extension 4+/5    Right Hip ABduction 4+/5    Left Hip Flexion 4+/5    Left Hip Extension 4+/5    Left Hip ABduction 4+/5    Right/Left Knee Right;Left    Right Knee Flexion 4+/5    Right Knee Extension 4+/5    Left Knee Flexion 4+/5    Left Knee Extension 4+/5      Palpation   Patella mobility WFL bilat    Palpation comment mild TTP distal anterior Rt knee                        Objective measurements completed on examination: See above findings.       [place] Adult PT Treatment/Exercise - 10/28/20 0001       Exercises   Exercises Knee/Hip      Knee/Hip Exercises: Stretches   Passive Hamstring Stretch Both;2 reps;30 seconds      Knee/Hip Exercises: Standing   Step Down Right;2 sets;5 reps;Hand Hold: 0;Step Height: 6"    Step Down Limitations laterally and forward    Functional Squat 10 reps    Functional Squat Limitations on bosu    Other  Standing Knee Exercises sidestep with green TB around feet x 10' bilat                     PT Education - 10/28/20 1008     Education Details PT POC and goals, HEP    Person(s) Educated Patient    Methods Explanation;Demonstration;Handout    Comprehension Returned demonstration;Verbalized understanding                 PT Long Term Goals - 10/28/20 1132       PT LONG TERM GOAL #1   Title Pt will be independent with HEP    Time 6    Period Weeks    Status New    Target Date 12/09/20      PT LONG TERM GOAL #2   Title Pt will improve FOTO to >= 84 to demo improved functional mobility    Time 6    Period Weeks    Status New    Target Date 12/09/20      PT LONG TERM GOAL #3   Title Pt will improve Rt LE SLS to >= 30 seconds to demo improved balance    Time 6    Period Weeks    Status New    Target Date 12/09/20      PT LONG TERM GOAL #4   Title Pt will run x 10 minutes with Rt knee pain <= 1/10    Time 6    Period Weeks    Status New    Target Date 12/09/20                    Plan - 10/28/20 1015     Clinical Impression Statement Pt is a 30 y/o male referred for Rt patellofemoral syndrome. Pt presents with Rt knee pain, decreased eccentric strength and control, decreased balance and will benefit from skilled PT to address deficits and improve functional mobility    Personal Factors and Comorbidities Time since onset of injury/illness/exacerbation    Examination-Activity Limitations Stairs;Locomotion Level    Examination-Participation Restrictions Occupation;Community Activity    Stability/Clinical Decision Making  Stable/Uncomplicated    Clinical Decision Making Low    Rehab Potential Good    PT Frequency 1x / week    PT Duration 6 weeks    PT Treatment/Interventions Cryotherapy;Electrical Stimulation;Iontophoresis '4mg'$ /ml Dexamethasone;Moist Heat;Stair training;Gait training;Balance training;Neuromuscular re-education;Therapeutic  exercise;Therapeutic activities;Dry needling;Taping;Patient/family education;Manual techniques;Vasopneumatic Device    PT Next Visit Plan assess HEP and progress knee strength and balance    PT Home Exercise Plan [email or web address]    Consulted and Agree with Plan of Care Patient             Patient will benefit from skilled therapeutic intervention in order to improve the following deficits and impairments:  Pain, Decreased strength, Decreased activity tolerance, Decreased balance, Decreased endurance  Visit Diagnosis: Chronic pain of right knee - Plan: PT plan of care cert/re-cert  Muscle weakness (generalized) - Plan: PT plan of care cert/re-cert  Other symptoms and signs involving the musculoskeletal system - Plan: PT plan of care cert/re-cert     Problem List Patient Active Problem List   Diagnosis Date Noted   Patellofemoral syndrome, right 10/19/2020   Myopia of both eyes 07/21/2019   Vitreous floater, bilateral 07/21/2019    Emmarie Sannes, PT 10/28/2020, 11:38 AM  [facility] [place], [place], [address] Phone: [phone]   Fax:  [phone]  Name: Adam Haynes MRN: [id number] Date of Birth: Aug 10, 1990

## 2020-10-28 NOTE — Patient Instructions (Signed)
Access Code: [email or web address] URL: [email or web address] Date: 10/28/2020 Prepared by: Isabelle Course  Exercises Hooklying Hamstring Stretch with Strap - 1 x daily - 7 x weekly - 3 sets - 1 reps - 20-30 sec hold Side Stepping with Resistance at Feet - 1 x daily - 7 x weekly - 3 sets - 10 reps Lateral Step Down - 1 x daily - 7 x weekly - 3 sets - 10 reps Forward Step Down - 1 x daily - 7 x weekly - 3 sets - 10 reps Squat on Flat Side of BOSU - 1 x daily - 7 x weekly - 3 sets - 10 reps

## 2020-11-09 ENCOUNTER — Ambulatory Visit: Payer: BC Managed Care – PPO | Admitting: Family Medicine

## 2020-11-09 ENCOUNTER — Encounter: Payer: Self-pay | Admitting: Family Medicine

## 2020-11-09 ENCOUNTER — Other Ambulatory Visit: Payer: Self-pay

## 2020-11-09 VITALS — BP 120/76 | HR 74 | Temp 98.4°F | Ht 70.0 in | Wt 235.0 lb

## 2020-11-09 DIAGNOSIS — I861 Scrotal varices: Secondary | ICD-10-CM

## 2020-11-09 DIAGNOSIS — N5089 Other specified disorders of the male genital organs: Secondary | ICD-10-CM | POA: Diagnosis not present

## 2020-11-09 NOTE — Progress Notes (Signed)
OFFICE VISIT  11/09/2020  CC:  Chief Complaint  Patient presents with   Groin pain    Persistent, occurring for a week and a half to 2 weeks.    HPI:    Patient is a 30 y.o.male who presents for "groin pain, felt lump in that area". Onset about 10d ago, insidious, mild, seems constant but more noticeable when sitting. Feels localized to L scrotum and pubic region on left. No preceding strain/injury, no excessive straining with BMs, no repetitive heavy lifting. He feels a little lump in scrotum on L, notes enlarged vein on L scrotum that he did not notice before.  No bulging in groin.   Past Medical History:  Diagnosis Date   Basal cell carcinoma 2018   R zygomatic region of face--Mohs   Blood in stool 2019   Colonoscopy->int hermorrhoids   Depression    History of esophagogastroduodenoscopy (EGD) 2019   for ?melena but he had recently taken pepto-->all normal.    Past Surgical History:  Procedure Laterality Date   COLONOSCOPY  2019   SKIN CANCER EXCISION  2018   [place] face   UPPER GI ENDOSCOPY  2019    Outpatient Medications Prior to Visit  Medication Sig Dispense Refill   naproxen (NAPROSYN) 500 MG tablet Take 1 tablet (500 mg total) by mouth 2 (two) times daily with a meal. 60 tablet 3   Omega-3 Fatty Acids (FISH OIL PO) Take by mouth every other day.     No facility-administered medications prior to visit.    No Known Allergies  ROS As per HPI  PE: Vitals with BMI 11/09/2020 08/18/2020 08/21/2019  Height 5\' 10"  5\' 10"  5\' 10"   Weight 235 lbs 234 lbs 233 lbs 4 oz  BMI 33.72 38.88 28.00  Systolic 349 179 150  Diastolic 76 82 84  Pulse 74 110 52     Gen: Alert, well appearing.  Patient is oriented to person, place, time, and situation. AFFECT: pleasant, lucid thought and speech. No groin or thigh tenderness.  No pain with resisted flexion, ext, aBduction or aDduction of hip or LL. No pain with hips ROM. No groin mass or protrusion. L testicle with soft and  moveable mass---one anterior to L testicle and one in posterolateral L hemiscrotum.  Nontender.  Testicle size symmetric.  No erythema.  LABS:  none  IMPRESSION AND PLAN:  Left scrotal mass and pain: exam c/w varicocele. Will obtain scrotal u/s for further evaluation.  An After Visit Summary was printed and given to the patient.  FOLLOW UP: Return for as needed.  Signed:  Crissie Sickles, MD           11/09/2020

## 2020-11-10 ENCOUNTER — Ambulatory Visit (INDEPENDENT_AMBULATORY_CARE_PROVIDER_SITE_OTHER): Payer: BC Managed Care – PPO

## 2020-11-10 DIAGNOSIS — I861 Scrotal varices: Secondary | ICD-10-CM | POA: Diagnosis not present

## 2020-11-10 DIAGNOSIS — N5089 Other specified disorders of the male genital organs: Secondary | ICD-10-CM | POA: Diagnosis not present

## 2020-11-10 DIAGNOSIS — N433 Hydrocele, unspecified: Secondary | ICD-10-CM | POA: Diagnosis not present

## 2020-11-10 DIAGNOSIS — N503 Cyst of epididymis: Secondary | ICD-10-CM | POA: Diagnosis not present

## 2020-11-11 ENCOUNTER — Ambulatory Visit (INDEPENDENT_AMBULATORY_CARE_PROVIDER_SITE_OTHER): Payer: BC Managed Care – PPO | Admitting: Physical Therapy

## 2020-11-11 ENCOUNTER — Ambulatory Visit: Payer: BC Managed Care – PPO | Admitting: Family Medicine

## 2020-11-11 ENCOUNTER — Other Ambulatory Visit: Payer: Self-pay

## 2020-11-11 DIAGNOSIS — G8929 Other chronic pain: Secondary | ICD-10-CM

## 2020-11-11 DIAGNOSIS — R29898 Other symptoms and signs involving the musculoskeletal system: Secondary | ICD-10-CM

## 2020-11-11 DIAGNOSIS — M25561 Pain in right knee: Secondary | ICD-10-CM

## 2020-11-11 DIAGNOSIS — M6281 Muscle weakness (generalized): Secondary | ICD-10-CM

## 2020-11-11 NOTE — Therapy (Signed)
[place], [place], [address] Phone: [phone]   Fax:  [phone]  Physical Therapy Treatment  Patient Details  Name: Adam Haynes MRN: [id number] Date of Birth: [birth year] Referring Provider (PT): Thekkekandam   Encounter Date: 11/11/2020   PT End of Session - 11/11/20 0931     Visit Number 2    Number of Visits 6    Date for PT Re-Evaluation 12/08/20    PT Start Time 0845    PT Stop Time 0923    PT Time Calculation (min) 38 min    Activity Tolerance Patient tolerated treatment well    Behavior During Therapy [facility] for tasks assessed/performed             Past Medical History:  Diagnosis Date   Basal cell carcinoma 2018   R zygomatic region of face--Mohs   Blood in stool 2019   Colonoscopy->int hermorrhoids   Depression    History of esophagogastroduodenoscopy (EGD) 2019   for ?melena but he had recently taken pepto-->all normal.    Past Surgical History:  Procedure Laterality Date   COLONOSCOPY  2019   SKIN CANCER EXCISION  2018   [place] face   UPPER GI ENDOSCOPY  2019    There were no vitals filed for this visit.   Subjective Assessment - 11/11/20 0849     Subjective Pt has been able to go to the gym and it is "easier to do stuff". Pt has been wearing the brace at the gym but not during PT exercises    Patient Stated Goals be able to run and hike with decreased symptoms    Currently in Pain? No/denies                [facility] PT Assessment - 11/11/20 0001       Assessment   Medical Diagnosis Rt patellofemoral syndrome    Referring Provider (PT) Thekkekandam    Onset Date/Surgical Date 10/30/19    Prior Therapy none      Single Leg Stance   Comments Rt and Lt 30 sec                           [facility] Adult PT Treatment/Exercise - 11/11/20 0001       Knee/Hip Exercises: Aerobic   Recumbent Bike 5 mins level 3      Knee/Hip Exercises: Machines  for Strengthening   Cybex Leg Press 7 plates x 20 bilat LE, single leg x 20 bilat 5 plates      Knee/Hip Exercises: Standing   SLS with forward reach to chair x 10, then runners SLS x 10 bilat    SLS with Vectors 10 reps bilat    Other Standing Knee Exercises sidestep with green TB around feet x 10' bilat    Other Standing Knee Exercises gait ladder: 1 foot in each, out/out/in/in forward and laterally, hop, high knees      Knee/Hip Exercises: Supine   Bridges 20 reps    Bridges Limitations with LEs on physioball with HS curl                     PT Education - 11/11/20 0931     Education Details updated HEP    Person(s) Educated Patient    Methods Explanation;Demonstration;Handout    Comprehension Returned demonstration;Verbalized understanding  PT Long Term Goals - 10/28/20 1132       PT LONG TERM GOAL #1   Title Pt will be independent with HEP    Time 6    Period Weeks    Status New    Target Date 12/09/20      PT LONG TERM GOAL #2   Title Pt will improve FOTO to >= 84 to demo improved functional mobility    Time 6    Period Weeks    Status New    Target Date 12/09/20      PT LONG TERM GOAL #3   Title Pt will improve Rt LE SLS to >= 30 seconds to demo improved balance    Time 6    Period Weeks    Status New    Target Date 12/09/20      PT LONG TERM GOAL #4   Title Pt will run x 10 minutes with Rt knee pain <= 1/10    Time 6    Period Weeks    Status New    Target Date 12/09/20                   Plan - 11/11/20 0932     Clinical Impression Statement Pt has progressed well with initial HEP and is able to tolerate more work outs at [employer]. Pt continues with deficits in SLS on Rt and with some "discomfort" during hopping and with physioball hamstring curls.  HEP updated and PT encouraged pt to attempt to run/jog before next session to assess progress    PT Next Visit Plan progress HEP, balance, knee strength,  plyometrics    PT Home Exercise Plan [email or web address]    Consulted and Agree with Plan of Care Patient             Patient will benefit from skilled therapeutic intervention in order to improve the following deficits and impairments:     Visit Diagnosis: Chronic pain of right knee  Muscle weakness (generalized)  Other symptoms and signs involving the musculoskeletal system     Problem List Patient Active Problem List   Diagnosis Date Noted   Patellofemoral syndrome, right 10/19/2020   Myopia of both eyes 07/21/2019   Vitreous floater, bilateral 07/21/2019    Jeanmarc Viernes, PT 11/11/2020, 9:40 AM  [facility] [place], [place], [address] Phone: [phone]   Fax:  [phone]  Name: Adam Haynes MRN: [id number] Date of Birth: 12/20/1990

## 2020-11-11 NOTE — Patient Instructions (Signed)
Access Code: [email or web address] URL: [email or web address] Date: 11/11/2020 Prepared by: Isabelle Course  Exercises Hooklying Hamstring Stretch with Strap - 1 x daily - 7 x weekly - 3 sets - 1 reps - 20-30 sec hold Forward T - 1 x daily - 7 x weekly - 3 sets - 10 reps Single Leg Balance with Clock Reach - 1 x daily - 7 x weekly - 3 sets - 10 reps Single Leg Deadlift with Kettlebell - 1 x daily - 7 x weekly - 3 sets - 10 reps Single Leg Running Balance - 1 x daily - 7 x weekly - 3 sets - 10 reps Supine Hamstring Curl on Swiss Ball - 1 x daily - 7 x weekly - 3 sets - 10 reps

## 2020-11-25 ENCOUNTER — Ambulatory Visit (INDEPENDENT_AMBULATORY_CARE_PROVIDER_SITE_OTHER): Payer: BC Managed Care – PPO | Admitting: Rehabilitative and Restorative Service Providers"

## 2020-11-25 ENCOUNTER — Other Ambulatory Visit: Payer: Self-pay

## 2020-11-25 ENCOUNTER — Encounter: Payer: Self-pay | Admitting: Rehabilitative and Restorative Service Providers"

## 2020-11-25 DIAGNOSIS — G8929 Other chronic pain: Secondary | ICD-10-CM | POA: Diagnosis not present

## 2020-11-25 DIAGNOSIS — R29898 Other symptoms and signs involving the musculoskeletal system: Secondary | ICD-10-CM | POA: Diagnosis not present

## 2020-11-25 DIAGNOSIS — M25561 Pain in right knee: Secondary | ICD-10-CM

## 2020-11-25 DIAGNOSIS — M6281 Muscle weakness (generalized): Secondary | ICD-10-CM

## 2020-11-25 NOTE — Patient Instructions (Signed)
Access Code: [email or web address] URL: [email or web address] Date: 11/25/2020 Prepared by: Gillermo Murdoch  Exercises Hooklying Hamstring Stretch with Strap - 1 x daily - 7 x weekly - 3 sets - 1 reps - 20-30 sec hold Forward T - 1 x daily - 7 x weekly - 3 sets - 10 reps Single Leg Balance with Clock Reach - 1 x daily - 7 x weekly - 3 sets - 10 reps Single Leg Deadlift with Kettlebell - 1 x daily - 7 x weekly - 3 sets - 10 reps Single Leg Running Balance - 1 x daily - 7 x weekly - 3 sets - 10 reps Supine Hamstring Curl on Swiss Ball - 1 x daily - 7 x weekly - 3 sets - 10 reps Supine Piriformis Stretch with Leg Straight - 2 x daily - 7 x weekly - 1 sets - 3 reps - 30 sec hold Supine ITB Stretch with Strap - 2 x daily - 7 x weekly - 1 sets - 3 reps - 30 sec hold Wall Squat Hold with Ball - 1 x daily - 7 x weekly - 1-2 sets - 10 reps - 5-10 sec hold

## 2020-11-25 NOTE — Therapy (Signed)
[place], [place], [address] Phone: [phone]   Fax:  [phone]  Physical Therapy Treatment  Patient Details  Name: Adam Haynes MRN: [id number] Date of Birth: 05-25-1990 Referring Provider (PT): Dr Dianah Field   Encounter Date: 11/25/2020   PT End of Session - 11/25/20 0803     Visit Number 3    Number of Visits 6    Date for PT Re-Evaluation 12/08/20    PT Start Time 0801    PT Stop Time 0845    PT Time Calculation (min) 44 min    Activity Tolerance Patient tolerated treatment well             Past Medical History:  Diagnosis Date   Basal cell carcinoma 2018   R zygomatic region of face--Mohs   Blood in stool 2019   Colonoscopy->int hermorrhoids   Depression    History of esophagogastroduodenoscopy (EGD) 2019   for ?melena but he had recently taken pepto-->all normal.    Past Surgical History:  Procedure Laterality Date   COLONOSCOPY  2019   SKIN CANCER EXCISION  2018   [place] face   UPPER GI ENDOSCOPY  2019    There were no vitals filed for this visit.   Subjective Assessment - 11/25/20 0803     Subjective Doing better. Was able to run some with no increase pain. Running after he does his exercises and that seemed to go ok. Noticed some pain Tuesday while standing at work.He was standing in one place for a longer time. Pain at 3-4/10 on Tuesday and lasted about an hour. Responds to stretching.    Currently in Pain? No/denies    Multiple Pain Sites No                [facility] PT Assessment - 11/25/20 0001       Assessment   Medical Diagnosis Rt patellofemoral syndrome    Referring Provider (PT) Dr Dianah Field    Onset Date/Surgical Date 10/30/19    Prior Therapy none      Flexibility   Hamstrings tight end range    ITB tight Rt > Lt    Piriformis tigth Rt > Lt                           [facility] Adult PT Treatment/Exercise - 11/25/20 0001        Knee/Hip Exercises: Stretches   Passive Hamstring Stretch Right;Left;1 rep;30 seconds    Passive Hamstring Stretch Limitations supine with strap    ITB Stretch Right;Left;2 reps;30 seconds    ITB Stretch Limitations supine with strap    Piriformis Stretch Right;Left;3 reps;30 seconds    Piriformis Stretch Limitations supine travell      Knee/Hip Exercises: Aerobic   Elliptical L2 x 5 min      Knee/Hip Exercises: Standing   Wall Squat 10 reps;10 seconds    Wall Squat Limitations ball btn knees to facilitate medial quad/adductors      Manual Therapy   Manual therapy comments instructed in transverse friction massage Rt lateral distal quad at superior patella x 1 min 2 x day for HEP; massage stick Quads/ITB; myofacial ball release posterior Rt hip                     PT Education - 11/25/20 0851     Education Details HEP    Person(s) Educated Patient  Methods Explanation;Demonstration;Verbal cues;Tactile cues;Handout    Comprehension Verbalized understanding;Returned demonstration;Verbal cues required;Tactile cues required                 PT Long Term Goals - 10/28/20 1132       PT LONG TERM GOAL #1   Title Pt will be independent with HEP    Time 6    Period Weeks    Status New    Target Date 12/09/20      PT LONG TERM GOAL #2   Title Pt will improve FOTO to >= 84 to demo improved functional mobility    Time 6    Period Weeks    Status New    Target Date 12/09/20      PT LONG TERM GOAL #3   Title Pt will improve Rt LE SLS to >= 30 seconds to demo improved balance    Time 6    Period Weeks    Status New    Target Date 12/09/20      PT LONG TERM GOAL #4   Title Pt will run x 10 minutes with Rt knee pain <= 1/10    Time 6    Period Weeks    Status New    Target Date 12/09/20                   Plan - 11/25/20 0807     Clinical Impression Statement Progressing wth knee strengthening. Working on exercises consistently. Did  run this week wihtou increased pain. Continues to have some pain with standing in one place for longer periods of time. Continued to work on LE stabilization and strengthening. Note tightness in pirifromis and ITB Rt > Lt. Added stretching to address tightness. Progressing toward stated goals of therapy.    Rehab Potential Good    PT Frequency 1x / week    PT Duration 6 weeks    PT Treatment/Interventions Cryotherapy;Electrical Stimulation;Iontophoresis 4mg /ml Dexamethasone;Moist Heat;Stair training;Gait training;Balance training;Neuromuscular re-education;Therapeutic exercise;Therapeutic activities;Dry needling;Taping;Patient/family education;Manual techniques;Vasopneumatic Device    PT Next Visit Plan progress HEP, balance, knee strength, plyometrics    PT Home Exercise Plan [email or web address]    Consulted and Agree with Plan of Care Patient             Patient will benefit from skilled therapeutic intervention in order to improve the following deficits and impairments:     Visit Diagnosis: Chronic pain of right knee  Muscle weakness (generalized)  Other symptoms and signs involving the musculoskeletal system     Problem List Patient Active Problem List   Diagnosis Date Noted   Patellofemoral syndrome, right 10/19/2020   Myopia of both eyes 07/21/2019   Vitreous floater, bilateral 07/21/2019    Rashidi Loh Nilda Simmer, PT, MPH  11/25/2020, 8:55 AM  [facility] [place], [place], [address] Phone: [phone]   Fax:  [phone]  Name: Adam Haynes MRN: [id number] Date of Birth: 04/11/1990

## 2020-11-30 ENCOUNTER — Ambulatory Visit (INDEPENDENT_AMBULATORY_CARE_PROVIDER_SITE_OTHER): Payer: BC Managed Care – PPO | Admitting: Sports Medicine

## 2020-11-30 DIAGNOSIS — M222X1 Patellofemoral disorders, right knee: Secondary | ICD-10-CM

## 2020-11-30 NOTE — Assessment & Plan Note (Addendum)
Adam Haynes has some degenerative changes on his x-rays, patellofemoral symptoms, physical therapy has worked well, he is doing really well, return as needed.

## 2020-11-30 NOTE — Progress Notes (Signed)
    Procedures performed today:    None.  Independent interpretation of notes and tests performed by another provider:   None.  Brief History, Exam, Impression, and Recommendations:    Patellofemoral syndrome, right [place] has some degenerative changes on his x-rays, patellofemoral symptoms, physical therapy has worked well, he is doing really well, return as needed.    ___________________________________________ Gwen Her. Dianah Field, M.D., ABFM., CAQSM. Primary Care and [place] Instructor of [place] [employer] [facility] [employer] [facility]

## 2020-12-09 ENCOUNTER — Ambulatory Visit (INDEPENDENT_AMBULATORY_CARE_PROVIDER_SITE_OTHER): Payer: BC Managed Care – PPO | Admitting: Physical Therapy

## 2020-12-09 ENCOUNTER — Other Ambulatory Visit: Payer: Self-pay

## 2020-12-09 DIAGNOSIS — G8929 Other chronic pain: Secondary | ICD-10-CM | POA: Diagnosis not present

## 2020-12-09 DIAGNOSIS — M6281 Muscle weakness (generalized): Secondary | ICD-10-CM

## 2020-12-09 DIAGNOSIS — R29898 Other symptoms and signs involving the musculoskeletal system: Secondary | ICD-10-CM | POA: Diagnosis not present

## 2020-12-09 DIAGNOSIS — M25561 Pain in right knee: Secondary | ICD-10-CM | POA: Diagnosis not present

## 2020-12-09 NOTE — Therapy (Signed)
[place] [place], [place], [address] Phone: [phone]   Fax:  [phone]  Physical Therapy Treatment and Discharge  Patient Details  Name: Adam Haynes MRN: [id number] Date of Birth: 08/14/1990 Referring Provider (PT): Dr Dianah Field   Encounter Date: 12/09/2020   PT End of Session - 12/09/20 0844     Visit Number 4    Number of Visits 6    Date for PT Re-Evaluation 12/08/20    PT Start Time 0800    PT Stop Time 0838    PT Time Calculation (min) 38 min    Activity Tolerance Patient tolerated treatment well    Behavior During Therapy [facility] for tasks assessed/performed             Past Medical History:  Diagnosis Date   Basal cell carcinoma 2018   R zygomatic region of face--Mohs   Blood in stool 2019   Colonoscopy->int hermorrhoids   Depression    History of esophagogastroduodenoscopy (EGD) 2019   for ?melena but he had recently taken pepto-->all normal.    Past Surgical History:  Procedure Laterality Date   COLONOSCOPY  2019   SKIN CANCER EXCISION  2018   [place] face   UPPER GI ENDOSCOPY  2019    There were no vitals filed for this visit.   Subjective Assessment - 12/09/20 0802     Subjective Pt states that he has been able to run without pain. He says that for the past week he has not had increased pain with standing still for long periods at work    Patient Stated Goals be able to run and hike with decreased symptoms    Currently in Pain? No/denies                [facility] PT Assessment - 12/09/20 0001       Assessment   Medical Diagnosis Rt patellofemoral syndrome    Referring Provider (PT) Dr Dianah Field    Onset Date/Surgical Date 10/30/19      Observation/Other Assessments   Focus on Therapeutic Outcomes (FOTO)  88      Single Leg Stance   Comments > 30 sec bilat                           [facility] Adult PT Treatment/Exercise - 12/09/20 0001        Knee/Hip Exercises: Stretches   Passive Hamstring Stretch Right;Left;1 rep;30 seconds    Passive Hamstring Stretch Limitations supine with strap    ITB Stretch Right;Left;2 reps;30 seconds    ITB Stretch Limitations supine with strap    Piriformis Stretch Right;Left;2 reps;30 seconds    Piriformis Stretch Limitations supine      Knee/Hip Exercises: Aerobic   Recumbent Bike 5 mins level 4      Knee/Hip Exercises: Standing   Wall Squat 10 reps;10 seconds    Wall Squat Limitations ball btn knees to facilitate medial quad/adductors    SLS kettlebell dead lift x 10, 10# KB      Knee/Hip Exercises: Supine   Bridges 20 reps    Bridges Limitations with LEs on physioball with HS curl                     PT Education - 12/09/20 0844     Education Details updated HEP, plan for d/c    Person(s) Educated Patient    Methods Explanation;Demonstration;Handout  Comprehension Verbalized understanding;Returned demonstration                 PT Long Term Goals - 12/09/20 0846       PT LONG TERM GOAL #1   Title Pt will be independent with HEP    Status Achieved      PT LONG TERM GOAL #2   Title Pt will improve FOTO to >= 84 to demo improved functional mobility    Baseline 88    Status Achieved      PT LONG TERM GOAL #3   Title Pt will improve Rt LE SLS to >= 30 seconds to demo improved balance    Status Achieved      PT LONG TERM GOAL #4   Title Pt will run x 10 minutes with Rt knee pain <= 1/10    Status Achieved                   Plan - 12/09/20 0845     Clinical Impression Statement Pt has progressed well and has met his goals. He is able to run and stand at work with little to no pain. He feels comfortable to d/c to HEP at this time    PT Next Visit Plan d/c    PT Home Exercise Plan [email or web address]    Consulted and Agree with Plan of Care Patient             Patient will benefit from skilled therapeutic intervention in order to improve the  following deficits and impairments:     Visit Diagnosis: Chronic pain of right knee  Muscle weakness (generalized)  Other symptoms and signs involving the musculoskeletal system     Problem List Patient Active Problem List   Diagnosis Date Noted   Patellofemoral syndrome, right 10/19/2020   Myopia of both eyes 07/21/2019   Vitreous floater, bilateral 07/21/2019   PHYSICAL THERAPY DISCHARGE SUMMARY  Visits from Start of Care: 4  Current functional level related to goals / functional outcomes: Able to run and stand without pain   Remaining deficits: See above   Education / Equipment: HEP   Patient agrees to discharge. Patient goals were met. Patient is being discharged due to meeting the stated rehab goals.  Marcel Sorter, PT 12/09/2020, 8:48 AM  [facility] [place], [place], [address] Phone: [phone]   Fax:  [phone]  Name: Riaan Toledo MRN: [id number] Date of Birth: [birth year]

## 2020-12-09 NOTE — Patient Instructions (Signed)
Access Code: [email or web address] URL: [email or web address] Date: 12/09/2020 Prepared by: Isabelle Course  Exercises Hooklying Hamstring Stretch with Strap - 1 x daily - 7 x weekly - 3 sets - 1 reps - 20-30 sec hold Supine Piriformis Stretch with Leg Straight - 2 x daily - 7 x weekly - 1 sets - 3 reps - 30 sec hold Supine ITB Stretch with Strap - 2 x daily - 7 x weekly - 1 sets - 3 reps - 30 sec hold Supine Hamstring Curl on Swiss Ball - 1 x daily - 7 x weekly - 3 sets - 10 reps Single Leg Deadlift with Kettlebell - 1 x daily - 7 x weekly - 3 sets - 10 reps Wall Squat Hold with Ball - 1 x daily - 7 x weekly - 1-2 sets - 10 reps - 5-10 sec hold

## 2021-04-27 DIAGNOSIS — Z85828 Personal history of other malignant neoplasm of skin: Secondary | ICD-10-CM | POA: Diagnosis not present

## 2021-04-27 DIAGNOSIS — D225 Melanocytic nevi of trunk: Secondary | ICD-10-CM | POA: Diagnosis not present

## 2021-04-27 DIAGNOSIS — L858 Other specified epidermal thickening: Secondary | ICD-10-CM | POA: Diagnosis not present

## 2021-04-27 DIAGNOSIS — Z08 Encounter for follow-up examination after completed treatment for malignant neoplasm: Secondary | ICD-10-CM | POA: Diagnosis not present

## 2021-12-22 DIAGNOSIS — F331 Major depressive disorder, recurrent, moderate: Secondary | ICD-10-CM | POA: Diagnosis not present

## 2022-02-02 DIAGNOSIS — F331 Major depressive disorder, recurrent, moderate: Secondary | ICD-10-CM | POA: Diagnosis not present

## 2022-03-13 NOTE — Patient Instructions (Signed)
Health Maintenance, Male Adopting a healthy lifestyle and getting preventive care are important in promoting health and wellness. Ask your health care provider about: The right schedule for you to have regular tests and exams. Things you can do on your own to prevent diseases and keep yourself healthy. What should I know about diet, weight, and exercise? Eat a healthy diet  Eat a diet that includes plenty of vegetables, fruits, low-fat dairy products, and lean protein. Do not eat a lot of foods that are high in solid fats, added sugars, or sodium. Maintain a healthy weight Body mass index (BMI) is a measurement that can be used to identify possible weight problems. It estimates body fat based on height and weight. Your health care provider can help determine your BMI and help you achieve or maintain a healthy weight. Get regular exercise Get regular exercise. This is one of the most important things you can do for your health. Most adults should: Exercise for at least 150 minutes each week. The exercise should increase your heart rate and make you sweat (moderate-intensity exercise). Do strengthening exercises at least twice a week. This is in addition to the moderate-intensity exercise. Spend less time sitting. Even light physical activity can be beneficial. Watch cholesterol and blood lipids Have your blood tested for lipids and cholesterol at 30 years of age, then have this test every 5 years. You may need to have your cholesterol levels checked more often if: Your lipid or cholesterol levels are high. You are older than 30 years of age. You are at high risk for heart disease. What should I know about cancer screening? Many types of cancers can be detected early and may often be prevented. Depending on your health history and family history, you may need to have cancer screening at various ages. This may include screening for: Colorectal cancer. Prostate cancer. Skin cancer. Lung  cancer. What should I know about heart disease, diabetes, and high blood pressure? Blood pressure and heart disease High blood pressure causes heart disease and increases the risk of stroke. This is more likely to develop in people who have high blood pressure readings or are overweight. Talk with your health care provider about your target blood pressure readings. Have your blood pressure checked: Every 3-5 years if you are 18-39 years of age. Every year if you are 40 years old or older. If you are between the ages of 65 and 75 and are a current or former smoker, ask your health care provider if you should have a one-time screening for abdominal aortic aneurysm (AAA). Diabetes Have regular diabetes screenings. This checks your fasting blood sugar level. Have the screening done: Once every three years after age 45 if you are at a normal weight and have a low risk for diabetes. More often and at a younger age if you are overweight or have a high risk for diabetes. What should I know about preventing infection? Hepatitis B If you have a higher risk for hepatitis B, you should be screened for this virus. Talk with your health care provider to find out if you are at risk for hepatitis B infection. Hepatitis C Blood testing is recommended for: Everyone born from 1945 through 1965. Anyone with known risk factors for hepatitis C. Sexually transmitted infections (STIs) You should be screened each year for STIs, including gonorrhea and chlamydia, if: You are sexually active and are younger than 30 years of age. You are older than 30 years of age and your   health care provider tells you that you are at risk for this type of infection. Your sexual activity has changed since you were last screened, and you are at increased risk for chlamydia or gonorrhea. Ask your health care provider if you are at risk. Ask your health care provider about whether you are at high risk for HIV. Your health care provider  may recommend a prescription medicine to help prevent HIV infection. If you choose to take medicine to prevent HIV, you should first get tested for HIV. You should then be tested every 3 months for as long as you are taking the medicine. Follow these instructions at home: Alcohol use Do not drink alcohol if your health care provider tells you not to drink. If you drink alcohol: Limit how much you have to 0-2 drinks a day. Know how much alcohol is in your drink. In the [place] one drink equals one 12 oz bottle of beer (355 mL), one 5 oz glass of wine (148 mL), or one 1 oz glass of hard liquor (44 mL). Lifestyle Do not use any products that contain nicotine or tobacco. These products include cigarettes, chewing tobacco, and vaping devices, such as e-cigarettes. If you need help quitting, ask your health care provider. Do not use street drugs. Do not share needles. Ask your health care provider for help if you need support or information about quitting drugs. General instructions Schedule regular health, dental, and eye exams. Stay current with your vaccines. Tell your health care provider if: You often feel depressed. You have ever been abused or do not feel safe at home. Summary Adopting a healthy lifestyle and getting preventive care are important in promoting health and wellness. Follow your health care provider's instructions about healthy diet, exercising, and getting tested or screened for diseases. Follow your health care provider's instructions on monitoring your cholesterol and blood pressure. This information is not intended to replace advice given to you by your health care provider. Make sure you discuss any questions you have with your health care provider. Document Revised: 06/20/2020 Document Reviewed: 06/20/2020 Elsevier Patient Education  2023 Elsevier Inc.  

## 2022-03-14 ENCOUNTER — Encounter: Payer: Self-pay | Admitting: Family Medicine

## 2022-03-14 ENCOUNTER — Ambulatory Visit (INDEPENDENT_AMBULATORY_CARE_PROVIDER_SITE_OTHER): Payer: BC Managed Care – PPO | Admitting: Family Medicine

## 2022-03-14 VITALS — BP 131/82 | HR 66 | Temp 98.9°F | Ht 70.0 in | Wt 250.0 lb

## 2022-03-14 DIAGNOSIS — R599 Enlarged lymph nodes, unspecified: Secondary | ICD-10-CM

## 2022-03-14 DIAGNOSIS — Z0000 Encounter for general adult medical examination without abnormal findings: Secondary | ICD-10-CM | POA: Diagnosis not present

## 2022-03-14 NOTE — Progress Notes (Signed)
Office Note 03/14/2022  CC:  Chief Complaint  Patient presents with   Annual Exam    Pt is fasting. Would like to confirm if there is anything else to do regarding ongoing swollen lymph nodes on his neck. Unchanged since 10/29/19 US soft tissue head & neck    HPI:  Patient is a 32 y.o. male who is here for annual health maintenance exam and concern of neck nodule. Adam Haynes is feeling well. He is seeking some reassurance once again about the lymph node he feels in the back of the neck. It has not changed in size.  It does not hurt.  No other areas of lymph node swelling have been noted.  10/29/19 US soft tissue neck: IMPRESSION: Patient's palpable area of concern involving base of the right-side of the occiput correlates with a benign appearing non pathologically enlarged subcutaneous lymph, nonspecific though presumably reactive in etiology.  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 He hikes regularly for exercise.  He is improving his diet.  Past Medical History:  Diagnosis Date   Basal cell carcinoma 2018   R zygomatic region of face--Mohs   Blood in stool 2019   Colonoscopy->int hermorrhoids   Depression    History of esophagogastroduodenoscopy (EGD) 2019   for ?melena but he had recently taken pepto-->all normal.    Past Surgical History:  Procedure Laterality Date   COLONOSCOPY  2019   SKIN CANCER EXCISION  2018   [place] face   UPPER GI ENDOSCOPY  2019    Family History  Problem Relation Age of Onset   Healthy Mother    High blood pressure Father    Heart disease Maternal Grandfather    Melanoma Paternal Grandmother    Dementia Paternal Grandfather    Parkinson's disease Paternal Grandfather     Social History   Socioeconomic History   Marital status: Single    Spouse name: Not on file   Number of children: Not on file   Years of education: Not on file   Highest education level: Bachelor's  degree (e.g., BA, AB, BS)  Occupational History   Occupation: Document Preservationist  Tobacco Use   Smoking status: Never   Smokeless tobacco: Never  Vaping Use   Vaping Use: Never used  Substance and Sexual Activity   Alcohol use: Yes    Alcohol/week: 2.0 - 3.0 standard drinks of alcohol    Types: 1 - 2 Cans of beer, 1 Standard drinks or equivalent per week   Drug use: Not Currently   Sexual activity: Not on file  Other Topics Concern   Not on file  Social History Narrative   Single.  Orig from [place], now in [place].   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   Social Determinants of Health   Financial Resource Strain: Not on file  Food Insecurity: Not on file  Transportation Needs: Not on file  Physical Activity: Not on file  Stress: Not on file  Social Connections: Not on file  Intimate Partner Violence: Not on file    Outpatient Medications Prior to Visit  Medication Sig Dispense Refill   Omega-3 Fatty Acids (FISH OIL PO) Take by mouth every other day.     No facility-administered medications prior to visit.    No Known Allergies  Review of Systems  Constitutional:  Negative for appetite change, chills, fatigue and fever.  HENT:  Negative for congestion, dental problem, ear pain and sore throat.   Eyes:  Negative for discharge, redness and visual disturbance.  Respiratory:  Negative for cough, chest tightness, shortness of breath and wheezing.   Cardiovascular:  Negative for chest pain, palpitations and leg swelling.  Gastrointestinal:  Negative for abdominal pain, blood in stool, diarrhea, nausea and vomiting.  Genitourinary:  Negative for difficulty urinating, dysuria, flank pain, frequency, hematuria and urgency.  Musculoskeletal:  Negative for arthralgias, back pain, joint swelling, myalgias and neck stiffness.  Skin:  Negative for pallor and rash.  Neurological:  Negative for dizziness, speech  difficulty, weakness and headaches.  Hematological:  Positive for adenopathy (R occip region). Does not bruise/bleed easily.  Psychiatric/Behavioral:  Negative for confusion and sleep disturbance. The patient is not nervous/anxious.    PE;    03/14/2022    1:35 PM 11/09/2020    3:26 PM 08/18/2020    2:01 PM  Vitals with BMI  Height '5\' 10"'$  '5\' 10"'$    Weight 250 lbs 235 lbs   BMI 84.16 60.63   Systolic 016 010 932  Diastolic 82 76 82  Pulse 66 74 110    Gen: Alert, well appearing.  Patient is oriented to person, place, time, and situation. AFFECT: pleasant, lucid thought and speech. ENT: Ears: EACs clear, normal epithelium.  TMs with good light reflex and landmarks bilaterally.  Eyes: no injection, icteris, swelling, or exudate.  EOMI, PERRLA. Nose: no drainage or turbinate edema/swelling.  No injection or focal lesion.  Mouth: lips without lesion/swelling.  Oral mucosa pink and moist.  Dentition intact and without obvious caries or gingival swelling.  Oropharynx without erythema, exudate, or swelling.  Neck: supple/nontender.  No LAD, mass, or TM.  Carotid pulses 2+ bilaterally, without bruits. CV: RRR, no m/r/g.   LUNGS: CTA bilat, nonlabored resps, good aeration in all lung fields. ABD: soft, NT, ND, BS normal.  No hepatospenomegaly or mass.  No bruits. EXT: no clubbing, cyanosis, or edema.  Musculoskeletal: no joint swelling, erythema, warmth, or tenderness.  ROM of all joints intact. Skin - no sores or suspicious lesions or rashes or color changes I cannot feel any lymph nodes or other nodular lesions in the neck or occipital region.  No lymphadenopathy is palpable in the axillary regions or antecubital fossae.  Pertinent labs:   Lab Results  Component Value Date   CREATININE 0.94 01/29/2019   BUN 13 01/29/2019   NA 138 01/29/2019   K 4.6 01/29/2019   CL 101 01/29/2019   CO2 30 01/29/2019   Lab Results  Component Value Date   ALT 52 01/29/2019   AST 29 01/29/2019   ALKPHOS  49 01/29/2019   BILITOT 0.7 01/29/2019   Lab Results  Component Value Date   CHOL 174 01/29/2019   Lab Results  Component Value Date   HDL 39.00 (L) 01/29/2019   No results found for: "LDLCALC" Lab Results  Component Value Date   TRIG 322.0 (H) 01/29/2019   Lab Results  Component Value Date   CHOLHDL 4 01/29/2019   ASSESSMENT AND PLAN:   #1 right suboccipital lymph node.  I cannot palpate this today.  At any rate, it is unchanged per patient report.  We have done blood and imaging eval of this in the past and it appeared benign. Reassurance given today.  #2 health maintenance exam: Reviewed age and gender appropriate health maintenance issues (prudent diet, regular exercise, health risks of tobacco and excessive alcohol, use of  seatbelts, fire alarms in home, use of sunscreen).  Also reviewed age and gender appropriate health screening as well as vaccine recommendations. Vaccines: Tdap->pt declined. Labs: none  An After Visit Summary was printed and given to the patient.  FOLLOW UP:  No follow-ups on file.  Signed:  Crissie Sickles, MD           03/14/2022

## 2022-03-29 DIAGNOSIS — F331 Major depressive disorder, recurrent, moderate: Secondary | ICD-10-CM | POA: Diagnosis not present

## 2022-04-11 DIAGNOSIS — F331 Major depressive disorder, recurrent, moderate: Secondary | ICD-10-CM | POA: Diagnosis not present

## 2022-04-12 IMAGING — DX DG KNEE 1-2V*L*
2 series · 2 of 2 positions shown · non-contrast
Comparison: None.

CLINICAL DATA: Right knee pain suspected to be patellar
subluxation. Patellofemoral syndrome.

EXAM:
RIGHT KNEE - COMPLETE 4+ VIEW; LEFT KNEE - 1-2 VIEW

[knee tunnel]
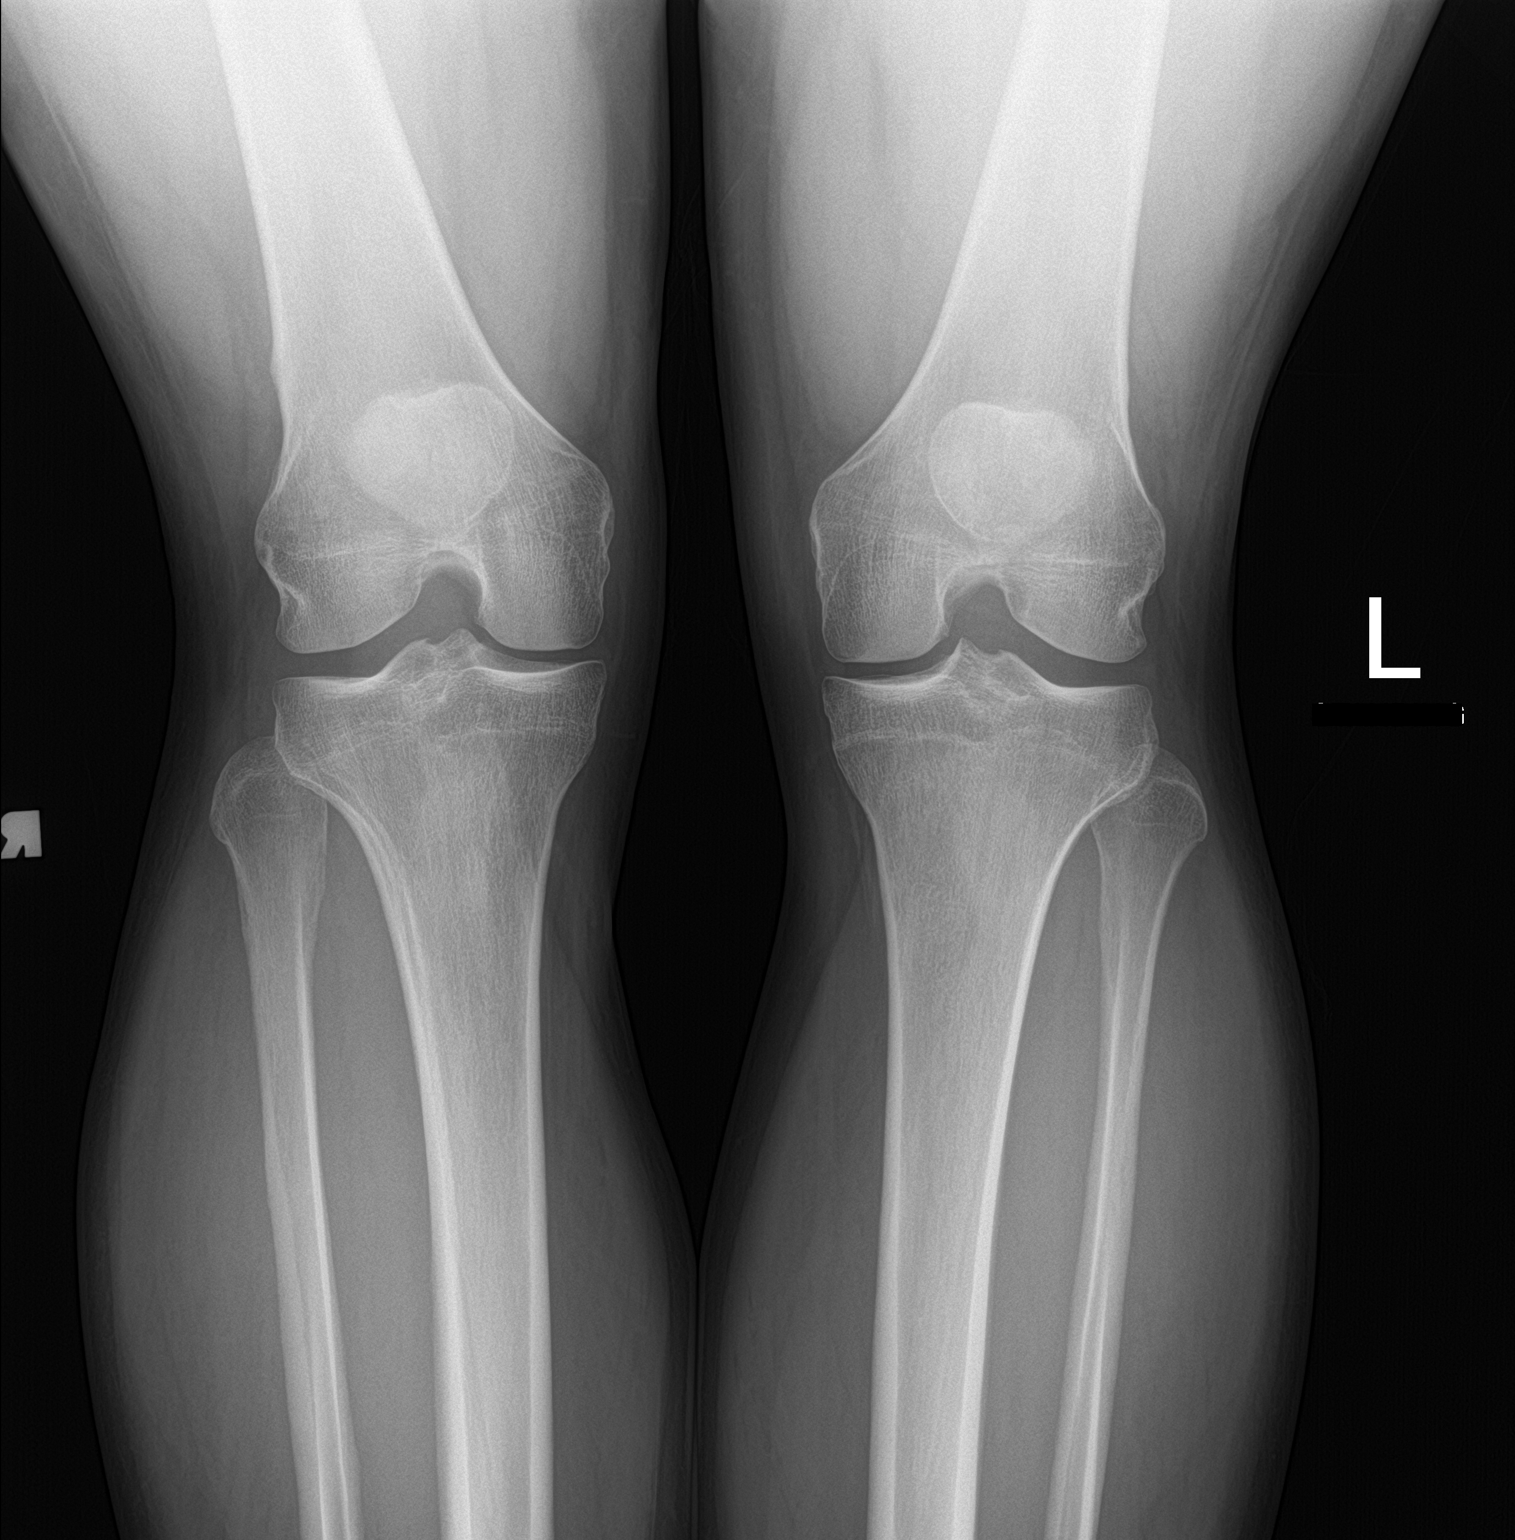

[knee ap bilat standing]
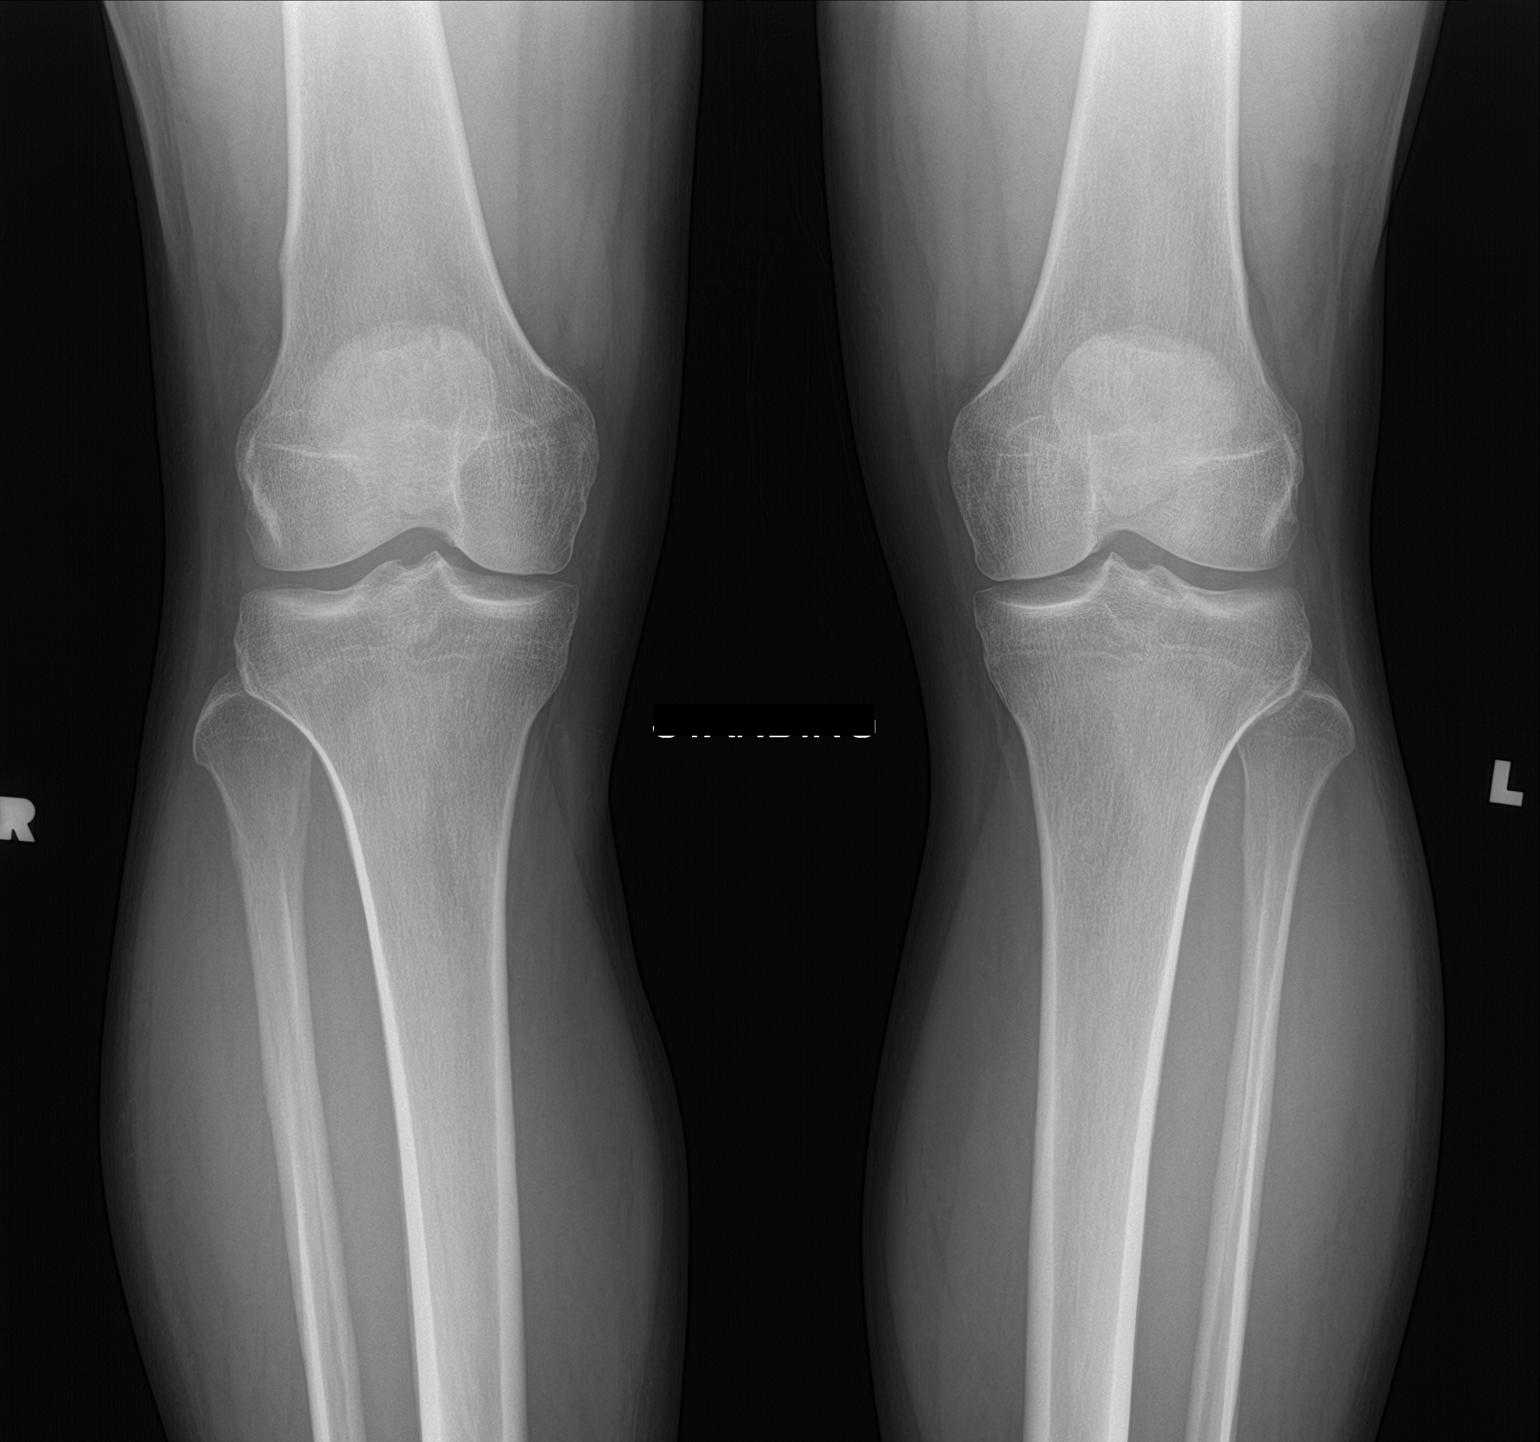

[2 of 2 positions shown; findings below may reference images not displayed]

FINDINGS: Standing AP and tunnel view of both knees obtained with standing
lateral as well as patellar sunrise view of the right knee.

Right knee: Normal joint spaces and alignment. Slightly deep
trochlear notch (angle of 110 degrees) but patellar is normally
seated in the trochlear groove. No fracture or erosion. There are
tiny medial tibiofemoral and patellofemoral osteophytes. No joint
effusion. No focal soft tissue abnormality.

AP and tunnel views of the left knee demonstrates normal alignment.
IMPRESSION: 1. Minimal medial tibiofemoral and patellofemoral osteophytes in the
right knee. Slight deep right trochlear notch.
2. Unremarkable radiographic appearance of the left knee in frontal
projection.

## 2022-04-25 DIAGNOSIS — F331 Major depressive disorder, recurrent, moderate: Secondary | ICD-10-CM | POA: Diagnosis not present

## 2022-04-27 DIAGNOSIS — F331 Major depressive disorder, recurrent, moderate: Secondary | ICD-10-CM | POA: Diagnosis not present

## 2022-04-30 DIAGNOSIS — L821 Other seborrheic keratosis: Secondary | ICD-10-CM | POA: Diagnosis not present

## 2022-04-30 DIAGNOSIS — L814 Other melanin hyperpigmentation: Secondary | ICD-10-CM | POA: Diagnosis not present

## 2022-04-30 DIAGNOSIS — L858 Other specified epidermal thickening: Secondary | ICD-10-CM | POA: Diagnosis not present

## 2022-04-30 DIAGNOSIS — D225 Melanocytic nevi of trunk: Secondary | ICD-10-CM | POA: Diagnosis not present

## 2022-05-04 IMAGING — US US SCROTUM W/ DOPPLER COMPLETE
1 series · 14 of 25 positions shown · non-contrast
Comparison: None.

CLINICAL DATA: Palpable abnormality left hemiscrotum for 1 week,
tendinous

EXAM:
SCROTAL ULTRASOUND
DOPPLER ULTRASOUND OF THE TESTICLES
TECHNIQUE: Complete ultrasound examination of the testicles, epididymis, and
other scrotal structures was performed. Color and spectral Doppler
ultrasound were also utilized to evaluate blood flow to the
testicles.

[Series 1: us scrotum w/doppler · 14 of 56 slices shown]
[im 1/56]
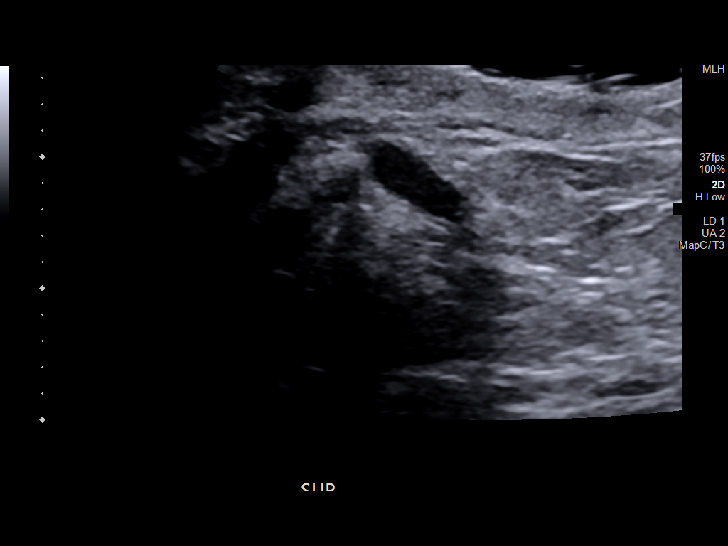
[im 5/56]
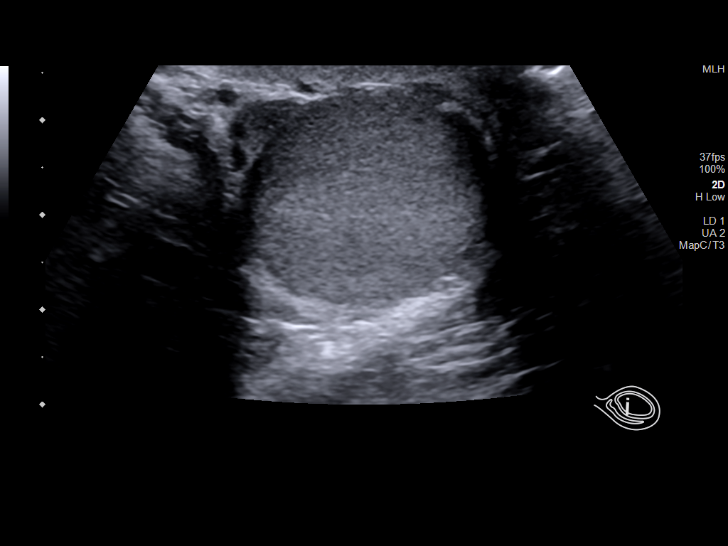
[im 10/56]
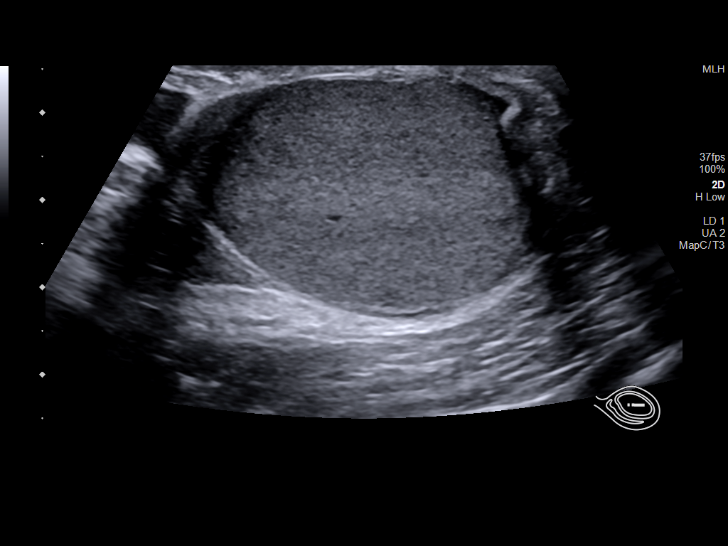
[im 14/56]
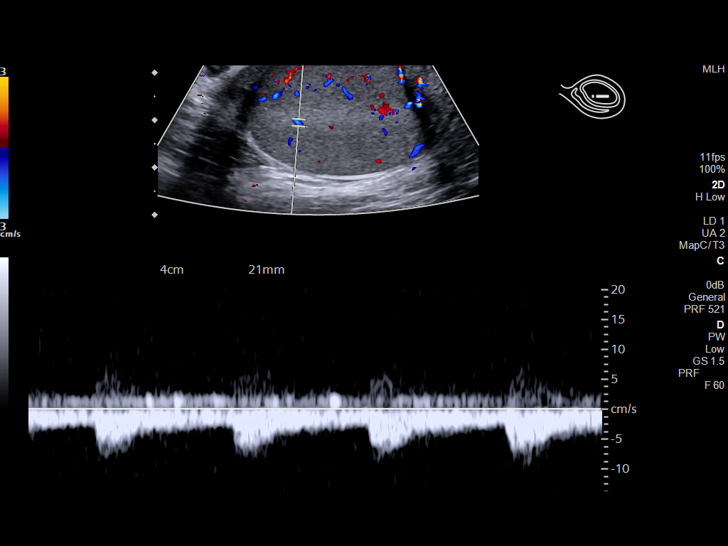
[im 19/56]
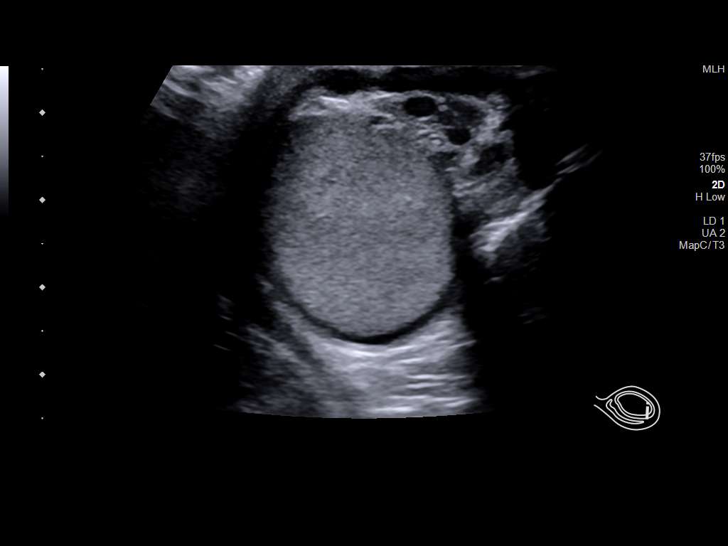
[im 21/56]
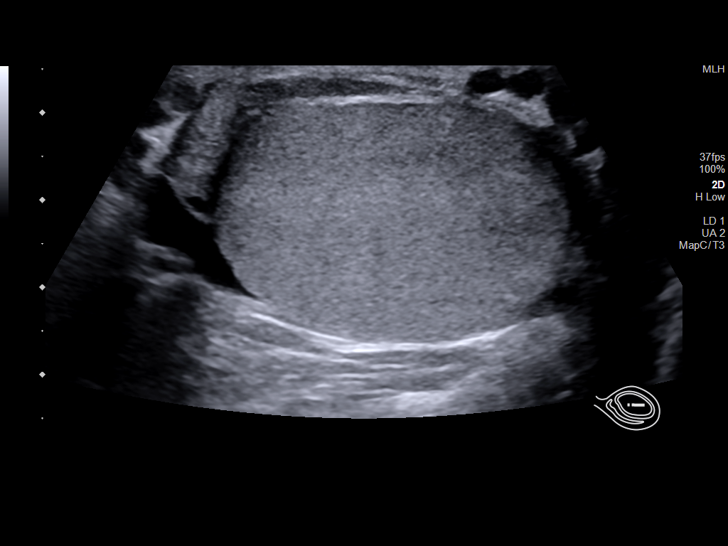
[im 26/56]
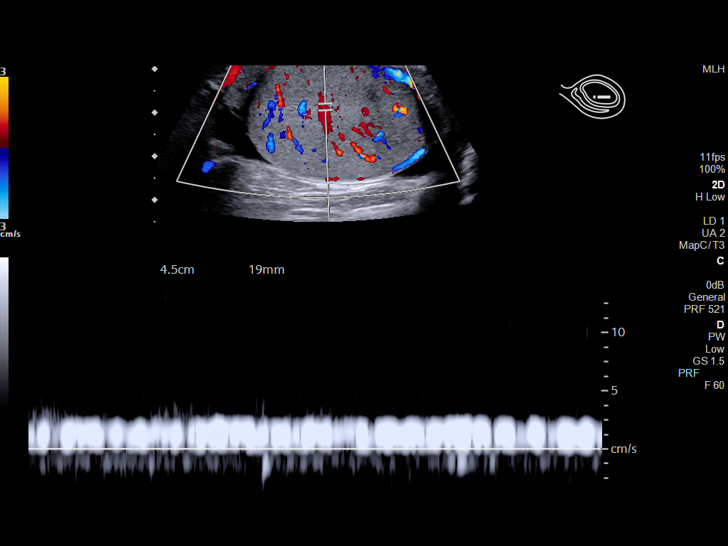
[im 30/56]
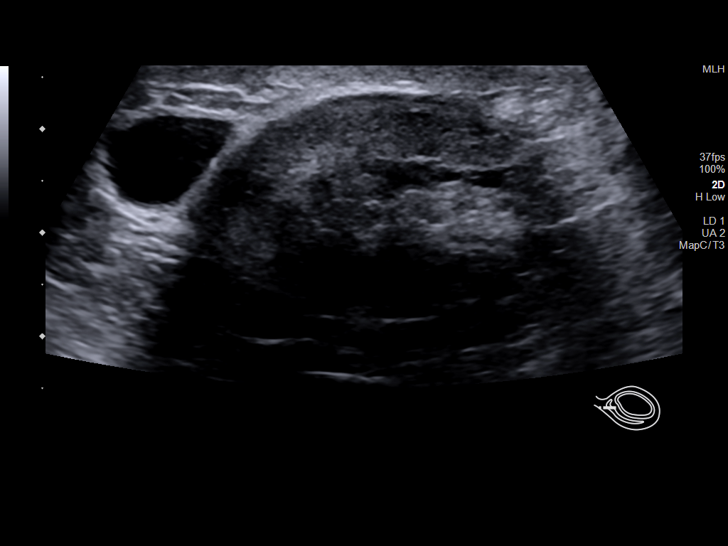
[im 35/56]
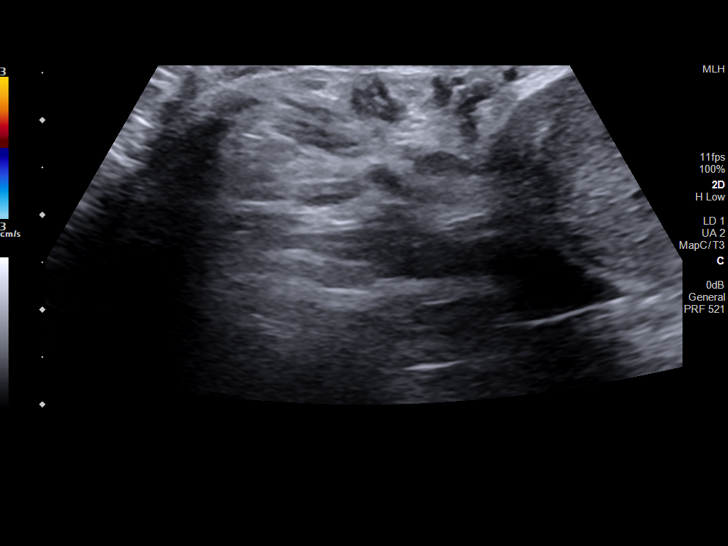
[im 37/56]
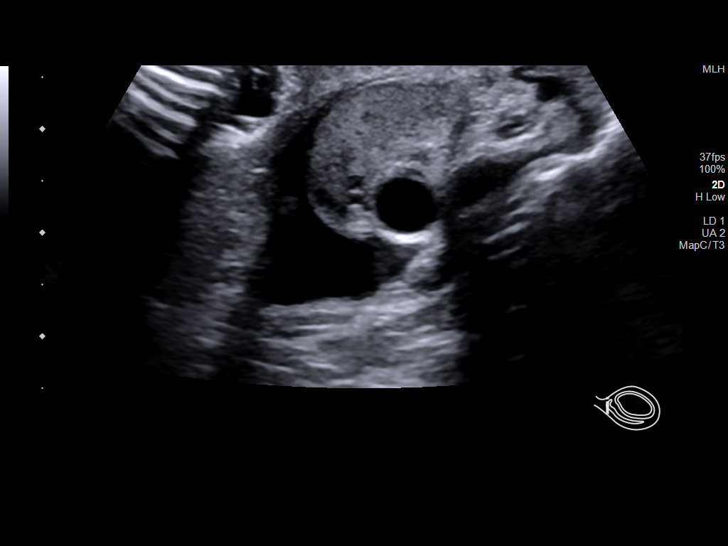
[im 42/56]
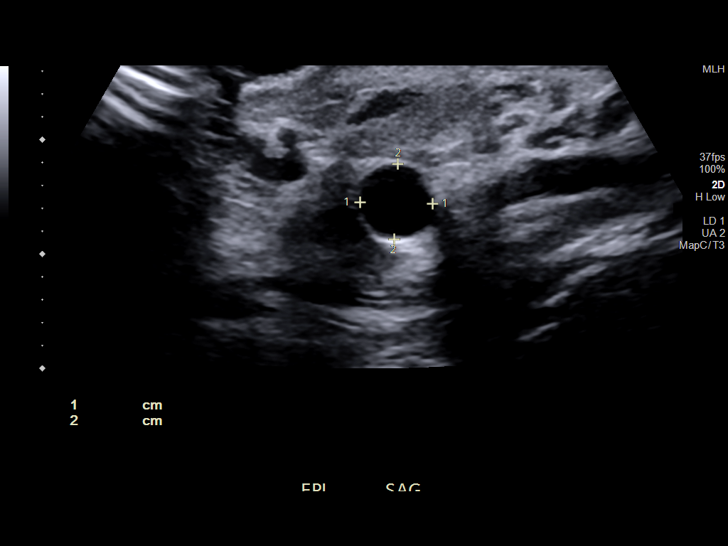
[im 46/56]
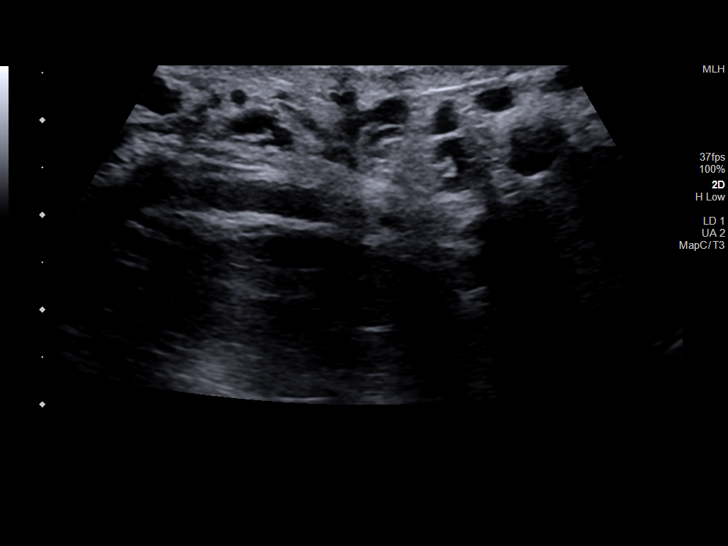
[im 51/56]
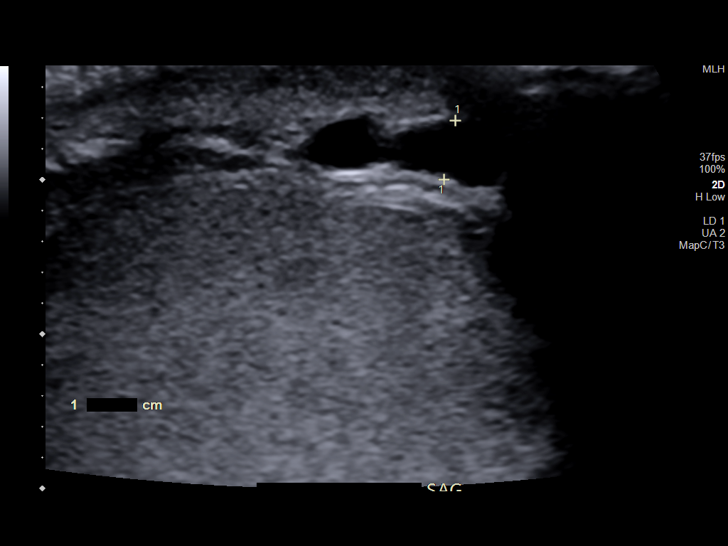
[im 56/56]
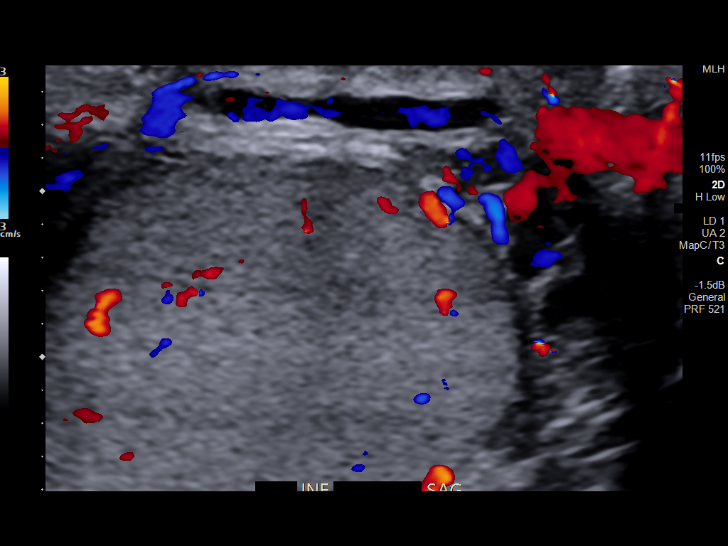

[14 of 25 positions shown; findings below may reference images not displayed]

FINDINGS: Right testicle

Measurements: 3.1 x 3.6 x 2.7 cm. No mass or microlithiasis
visualized.

Left testicle

Measurements: 2.9 x 4.0 x 2.8 cm. No mass or microlithiasis
visualized.

Right epididymis:  Normal in size and appearance.

Left epididymis: Normal in size. 7 mm left epididymal cyst is
identified.

Hydrocele:  None visualized.

Varicocele: Bilateral hydroceles are identified, left greater than
right.

Pulsed Doppler interrogation of both testes demonstrates normal low
resistance arterial and venous waveforms bilaterally.
IMPRESSION: 1. Bilateral hydroceles, left greater than right.
2. Benign left epididymal cyst measuring 7 mm, correlate with site
of patient palpable abnormality.
3. Unremarkable testes.

## 2022-05-11 DIAGNOSIS — F331 Major depressive disorder, recurrent, moderate: Secondary | ICD-10-CM | POA: Diagnosis not present

## 2022-05-25 DIAGNOSIS — F331 Major depressive disorder, recurrent, moderate: Secondary | ICD-10-CM | POA: Diagnosis not present

## 2022-06-01 DIAGNOSIS — H9191 Unspecified hearing loss, right ear: Secondary | ICD-10-CM | POA: Diagnosis not present

## 2022-06-01 DIAGNOSIS — H6121 Impacted cerumen, right ear: Secondary | ICD-10-CM | POA: Diagnosis not present

## 2022-06-01 DIAGNOSIS — F331 Major depressive disorder, recurrent, moderate: Secondary | ICD-10-CM | POA: Diagnosis not present

## 2022-06-15 DIAGNOSIS — F331 Major depressive disorder, recurrent, moderate: Secondary | ICD-10-CM | POA: Diagnosis not present

## 2022-06-29 DIAGNOSIS — F331 Major depressive disorder, recurrent, moderate: Secondary | ICD-10-CM | POA: Diagnosis not present

## 2022-07-19 ENCOUNTER — Encounter: Payer: Self-pay | Admitting: Family Medicine

## 2022-07-19 ENCOUNTER — Ambulatory Visit: Payer: BC Managed Care – PPO | Admitting: Family Medicine

## 2022-07-19 VITALS — BP 131/81 | HR 82 | Ht 70.0 in

## 2022-07-19 DIAGNOSIS — T50905D Adverse effect of unspecified drugs, medicaments and biological substances, subsequent encounter: Secondary | ICD-10-CM

## 2022-07-19 DIAGNOSIS — K219 Gastro-esophageal reflux disease without esophagitis: Secondary | ICD-10-CM | POA: Diagnosis not present

## 2022-07-19 DIAGNOSIS — R42 Dizziness and giddiness: Secondary | ICD-10-CM | POA: Diagnosis not present

## 2022-07-19 NOTE — Progress Notes (Signed)
OFFICE VISIT  07/19/2022  CC:  Chief Complaint  Patient presents with   Indigestion    Pt reports stopping Prozac 1 month ago due to side effects; 3 incidents of vertigo since then; muscle soreness under L side of chest/ribcage area, unsure if related to stopping Prozac; has changed diet slightly.     Patient is a 32 y.o. male who presents for a few concerns.  HPI: Diet has not been as good the last 6 months, says he started to get some reflux symptoms that were more bothersome and persistent, got even worse after getting on Prozac.  Was feeling bloated, burping a lot.  Decided to back down on dose of Prozac and is now currently completely off it for a while. Also, with having some vague pressure in the subcostal regions for a few seconds at a time around the time he was having the reflux and this concerned him because it was unusual for him. Also, he had a couple of 15-second episodes of the sensation of moving back-and-forth--almost like a vertigo sensation.  No specific trigger that he recalls.  Fortunately, he has not had any  Past Medical History:  Diagnosis Date   Basal cell carcinoma 2018   R zygomatic region of face--Mohs   Blood in stool 2019   Colonoscopy->int hermorrhoids   Depression    History of esophagogastroduodenoscopy (EGD) 2019   for ?melena but he had recently taken pepto-->all normal.    Past Surgical History:  Procedure Laterality Date   COLONOSCOPY  2019   SKIN CANCER EXCISION  2018   [place] face   UPPER GI ENDOSCOPY  2019    Outpatient Medications Prior to Visit  Medication Sig Dispense Refill   FLUoxetine (PROZAC) 10 MG capsule Take 10 mg by mouth daily. (Patient not taking: Reported on 07/19/2022)     No facility-administered medications prior to visit.    No Known Allergies  Review of Systems  As per HPI  PE:    07/19/2022    3:53 PM 03/14/2022    1:35 PM 11/09/2020    3:26 PM  Vitals with BMI  Height 5\' 10"  5\' 10"  5\' 10"   Weight  250 lbs 235  lbs  BMI  35.87 33.72  Systolic 131 131 161  Diastolic 81 82 76  Pulse 82 66 74     Physical Exam  Gen: Alert, well appearing.  Patient is oriented to person, place, time, and situation. AFFECT: pleasant, lucid thought and speech. WRU:EAVW: no injection, icteris, swelling, or exudate.  EOMI, PERRLA. Mouth: lips without lesion/swelling.  Oral mucosa pink and moist. Oropharynx without erythema, exudate, or swelling.  Neck - No masses or thyromegaly or limitation in range of motion CV: RRR, no m/r/g.   LUNGS: CTA bilat, nonlabored resps, good aeration in all lung fields. ABD: soft, NT, ND, BS normal.  No hepatospenomegaly or mass.  No bruits. EXT: no clubbing or cyanosis.  no edema.   LABS:  none  IMPRESSION AND PLAN:  #1 GERD and dyspepsia. Discussed improving diet.  This is all much better since getting off his fluoxetine.  #2 subcostal discomfort/pressure.  Brief, happened a couple of months ago. I do not think it is related to #1 above.  Reassured.  #3 vertigo. Unknown etiology.  Has not happened in a couple months, fortunately. Possibly related to fluoxetine. Reassured, observe for recurrences/new symptoms.  An After Visit Summary was printed and given to the patient.  FOLLOW UP: No follow-ups on file.  Signed:  Santiago Bumpers, MD           07/19/2022

## 2022-07-20 DIAGNOSIS — F331 Major depressive disorder, recurrent, moderate: Secondary | ICD-10-CM | POA: Diagnosis not present

## 2022-08-03 DIAGNOSIS — F331 Major depressive disorder, recurrent, moderate: Secondary | ICD-10-CM | POA: Diagnosis not present

## 2022-08-17 DIAGNOSIS — F331 Major depressive disorder, recurrent, moderate: Secondary | ICD-10-CM | POA: Diagnosis not present

## 2022-08-31 DIAGNOSIS — F331 Major depressive disorder, recurrent, moderate: Secondary | ICD-10-CM | POA: Diagnosis not present

## 2022-09-14 DIAGNOSIS — F331 Major depressive disorder, recurrent, moderate: Secondary | ICD-10-CM | POA: Diagnosis not present

## 2022-09-28 DIAGNOSIS — F331 Major depressive disorder, recurrent, moderate: Secondary | ICD-10-CM | POA: Diagnosis not present

## 2022-10-12 DIAGNOSIS — F331 Major depressive disorder, recurrent, moderate: Secondary | ICD-10-CM | POA: Diagnosis not present

## 2022-10-26 DIAGNOSIS — F331 Major depressive disorder, recurrent, moderate: Secondary | ICD-10-CM | POA: Diagnosis not present

## 2022-10-31 DIAGNOSIS — L858 Other specified epidermal thickening: Secondary | ICD-10-CM | POA: Diagnosis not present

## 2022-10-31 DIAGNOSIS — Z08 Encounter for follow-up examination after completed treatment for malignant neoplasm: Secondary | ICD-10-CM | POA: Diagnosis not present

## 2022-10-31 DIAGNOSIS — L718 Other rosacea: Secondary | ICD-10-CM | POA: Diagnosis not present

## 2022-10-31 DIAGNOSIS — L02221 Furuncle of abdominal wall: Secondary | ICD-10-CM | POA: Diagnosis not present

## 2022-11-05 DIAGNOSIS — E6609 Other obesity due to excess calories: Secondary | ICD-10-CM | POA: Diagnosis not present

## 2022-11-05 DIAGNOSIS — F32A Depression, unspecified: Secondary | ICD-10-CM | POA: Diagnosis not present

## 2022-11-05 DIAGNOSIS — R12 Heartburn: Secondary | ICD-10-CM | POA: Diagnosis not present

## 2022-11-05 DIAGNOSIS — Z6837 Body mass index (BMI) 37.0-37.9, adult: Secondary | ICD-10-CM | POA: Diagnosis not present

## 2022-11-05 DIAGNOSIS — R030 Elevated blood-pressure reading, without diagnosis of hypertension: Secondary | ICD-10-CM | POA: Diagnosis not present

## 2022-11-16 DIAGNOSIS — F331 Major depressive disorder, recurrent, moderate: Secondary | ICD-10-CM | POA: Diagnosis not present

## 2022-11-19 DIAGNOSIS — R059 Cough, unspecified: Secondary | ICD-10-CM | POA: Diagnosis not present

## 2022-11-19 DIAGNOSIS — Z6837 Body mass index (BMI) 37.0-37.9, adult: Secondary | ICD-10-CM | POA: Diagnosis not present

## 2022-11-19 DIAGNOSIS — E6609 Other obesity due to excess calories: Secondary | ICD-10-CM | POA: Diagnosis not present

## 2022-11-19 DIAGNOSIS — R12 Heartburn: Secondary | ICD-10-CM | POA: Diagnosis not present

## 2022-11-30 DIAGNOSIS — F331 Major depressive disorder, recurrent, moderate: Secondary | ICD-10-CM | POA: Diagnosis not present

## 2022-12-21 DIAGNOSIS — F331 Major depressive disorder, recurrent, moderate: Secondary | ICD-10-CM | POA: Diagnosis not present

## 2023-01-11 DIAGNOSIS — F331 Major depressive disorder, recurrent, moderate: Secondary | ICD-10-CM | POA: Diagnosis not present

## 2023-01-24 DIAGNOSIS — F331 Major depressive disorder, recurrent, moderate: Secondary | ICD-10-CM | POA: Diagnosis not present

## 2023-03-18 ENCOUNTER — Ambulatory Visit: Payer: BC Managed Care – PPO | Admitting: Family Medicine

## 2023-03-18 ENCOUNTER — Other Ambulatory Visit (HOSPITAL_COMMUNITY)
Admission: RE | Admit: 2023-03-18 | Discharge: 2023-03-18 | Disposition: A | Discharge status: Home / Self Care | Payer: BC Managed Care – PPO | Source: Ambulatory Visit | Attending: Family Medicine | Admitting: Family Medicine

## 2023-03-18 ENCOUNTER — Encounter: Payer: Self-pay | Admitting: Family Medicine

## 2023-03-18 VITALS — BP 129/85 | HR 74 | Ht 71.0 in | Wt 268.4 lb

## 2023-03-18 DIAGNOSIS — R300 Dysuria: Secondary | ICD-10-CM | POA: Diagnosis not present

## 2023-03-18 DIAGNOSIS — N410 Acute prostatitis: Secondary | ICD-10-CM | POA: Diagnosis not present

## 2023-03-18 DIAGNOSIS — Z0000 Encounter for general adult medical examination without abnormal findings: Secondary | ICD-10-CM

## 2023-03-18 LAB — POCT URINALYSIS DIPSTICK
Bilirubin, UA: NEGATIVE
Blood, UA: NEGATIVE
Glucose, UA: NEGATIVE
Ketones, UA: NEGATIVE
Leukocytes, UA: NEGATIVE
Nitrite, UA: NEGATIVE
Protein, UA: NEGATIVE
Spec Grav, UA: 1.02 (ref 1.010–1.025)
Urobilinogen, UA: NEGATIVE U/dL — AB
pH, UA: 6 (ref 5.0–8.0)

## 2023-03-18 MED ORDER — SULFAMETHOXAZOLE-TRIMETHOPRIM 800-160 MG PO TABS: 1.0000 | ORAL_TABLET | Freq: Two times a day (BID) | ORAL | 0 refills | Status: AC

## 2023-03-18 NOTE — Progress Notes (Signed)
Office Note 03/18/2023  CC:  Chief Complaint  Patient presents with   Annual Exam   Patient is a 33 y.o. male who is here for annual health maintenance exam and follow-up. A/P as of last visit: "#1 GERD and dyspepsia. Discussed improving diet.  This is all much better since getting off his fluoxetine.   #2 subcostal discomfort/pressure.  Brief, happened a couple of months ago. I do not think it is related to #1 above.  Reassured.   #3 vertigo. Unknown etiology.  Has not happened in a couple months, fortunately. Possibly related to fluoxetine. Reassured, observe for recurrences/new symptoms."  INTERIM HX: Doing very well overall. About 1 week ago he developed some voiding discomfort.  He has a little bit of burning intermittently when he urinates and sometimes the feeling of incomplete emptying.  Mild discomfort in the suprapubic region intermittently.  No blood in the urine, no flank pain, no abdominal pain, no nausea.  No penile discharge.  He had a significant URI with cough back in December, now has a little bit of residual dry cough but otherwise back to normal.  He no longer takes Prozac.  He has a serious girlfriend. He has no known STD and says his partner does not either. He is open to [facility] and chlamydia testing, though.   Past Medical History:  Diagnosis Date   Basal cell carcinoma 2018   R zygomatic region of face--Mohs   Blood in stool 2019   Colonoscopy->int hermorrhoids   Depression    History of esophagogastroduodenoscopy (EGD) 2019   for ?melena but he had recently taken pepto-->all normal.    Past Surgical History:  Procedure Laterality Date   COLONOSCOPY  2019   SKIN CANCER EXCISION  2018   BCC face   UPPER GI ENDOSCOPY  2019    Family History  Problem Relation Age of Onset   Healthy Mother    High blood pressure Father    Heart disease Maternal Grandfather    Melanoma Paternal Grandmother    Dementia Paternal Grandfather    Parkinson's  disease Paternal Grandfather     Social History   Socioeconomic History   Marital status: Single    Spouse name: Not on file   Number of children: Not on file   Years of education: Not on file   Highest education level: Bachelor's degree (e.g., BA, AB, BS)  Occupational History   Occupation: Document Preservationist  Tobacco Use   Smoking status: Never   Smokeless tobacco: Never  Vaping Use   Vaping status: Never Used  Substance and Sexual Activity   Alcohol use: Yes    Alcohol/week: 2.0 - 3.0 standard drinks of alcohol    Types: 1 - 2 Cans of beer, 1 Standard drinks or equivalent per week   Drug use: Not Currently   Sexual activity: Not on file  Other Topics Concern   Not on file  Social History Narrative   Single.  Orig from [place], now in [place].   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   Social Drivers of Corporate investment banker Strain: Not on file  Food Insecurity: Not on file  Transportation Needs: Not on file  Physical Activity: Not on file  Stress: Not on file  Social Connections: Unknown (06/24/2021)   [facility], [facility]   Social Network    Social Network: Not on file  Intimate Partner Violence: Unknown (  05/17/2021)   Received from [facility], [facility]   HITS    Physically Hurt: Not on file    Insult or Talk Down To: Not on file    Threaten Physical Harm: Not on file    Scream or Curse: Not on file    Outpatient Medications Prior to Visit  Medication Sig Dispense Refill   FLUoxetine (PROZAC) 10 MG capsule Take 10 mg by mouth daily. (Patient not taking: Reported on 03/18/2023)     No facility-administered medications prior to visit.    No Known Allergies  Review of Systems  Constitutional:  Negative for appetite change, chills, fatigue and fever.  HENT:  Negative for congestion, dental problem, ear pain and sore throat.   Eyes:  Negative for discharge, redness  and visual disturbance.  Respiratory:  Negative for cough, chest tightness, shortness of breath and wheezing.   Cardiovascular:  Negative for chest pain, palpitations and leg swelling.  Gastrointestinal:  Negative for abdominal pain, blood in stool, diarrhea, nausea and vomiting.  Genitourinary:  Positive for dysuria. Negative for difficulty urinating, flank pain, frequency, hematuria and urgency.  Musculoskeletal:  Negative for arthralgias, back pain, joint swelling, myalgias and neck stiffness.  Skin:  Negative for pallor and rash.  Neurological:  Negative for dizziness, speech difficulty, weakness and headaches.  Hematological:  Negative for adenopathy. Does not bruise/bleed easily.  Psychiatric/Behavioral:  Negative for confusion and sleep disturbance. The patient is not nervous/anxious.     PE;    03/18/2023    1:04 PM 07/19/2022    3:53 PM 03/14/2022    1:35 PM  Vitals with BMI  Height 5\' 11"  5\' 10"  5\' 10"   Weight 268 lbs 6 oz  250 lbs  BMI 37.45  35.87  Systolic 129 131 604  Diastolic 85 81 82  Pulse 74 82 66   Gen: Alert, well appearing.  Patient is oriented to person, place, time, and situation. AFFECT: pleasant, lucid thought and speech. ENT: Ears: EACs clear, normal epithelium.  TMs with good light reflex and landmarks bilaterally.  Eyes: no injection, icteris, swelling, or exudate.  EOMI, PERRLA. Nose: no drainage or turbinate edema/swelling.  No injection or focal lesion.  Mouth: lips without lesion/swelling.  Oral mucosa pink and moist.  Dentition intact and without obvious caries or gingival swelling.  Oropharynx without erythema, exudate, or swelling.  Neck: supple/nontender.  No LAD, mass, or TM.  Carotid pulses 2+ bilaterally, without bruits. CV: RRR, no m/r/g.   LUNGS: CTA bilat, nonlabored resps, good aeration in all lung fields. ABD: soft, NT, ND, BS normal.  No hepatospenomegaly or mass.  No bruits. EXT: no clubbing, cyanosis, or edema.  Musculoskeletal: no joint  swelling, erythema, warmth, or tenderness.  ROM of all joints intact. Skin - no sores or suspicious lesions or rashes or color changes  Pertinent labs:   Lab Results  Component Value Date   CREATININE 0.94 01/29/2019   BUN 13 01/29/2019   NA 138 01/29/2019   K 4.6 01/29/2019   CL 101 01/29/2019   CO2 30 01/29/2019   Lab Results  Component Value Date   ALT 52 01/29/2019   AST 29 01/29/2019   ALKPHOS 49 01/29/2019   BILITOT 0.7 01/29/2019   Lab Results  Component Value Date   CHOL 174 01/29/2019   Lab Results  Component Value Date   HDL 39.00 (L) 01/29/2019    Lab Results  Component Value Date   TRIG 322.0 (H) 01/29/2019   Lab Results  Component Value Date   CHOLHDL 4 01/29/2019   ASSESSMENT AND PLAN:   #1 health maintenance exam: Reviewed age and gender appropriate health maintenance issues (prudent diet, regular exercise, health risks of tobacco and excessive alcohol, use of seatbelts, fire alarms in home, use of sunscreen).  Also reviewed age and gender appropriate health screening as well as vaccine recommendations. Vaccines: Flu-> declined. Labs: None indicated  #2 urinary voiding discomfort, suspect acute prostatitis.   UA normal here today. Treat for acute prostatitis: Bactrim double strength, 1 twice daily x 14 days. Send urine for culture and also for GC/chlamydia testing.  An After Visit Summary was printed and given to the patient.  FOLLOW UP:  Return in about 1 year (around 03/17/2024) for annual CPE (fasting).  Signed:  Santiago Bumpers, MD           03/18/2023

## 2023-03-19 LAB — URINE CYTOLOGY ANCILLARY ONLY
Chlamydia: NEGATIVE
Comment: NEGATIVE
Comment: NORMAL
Neisseria Gonorrhea: NEGATIVE

## 2023-03-20 LAB — URINE CULTURE
MICRO NUMBER:: 16035128
Result:: NO GROWTH
SPECIMEN QUALITY:: ADEQUATE

## 2023-05-01 DIAGNOSIS — D225 Melanocytic nevi of trunk: Secondary | ICD-10-CM | POA: Diagnosis not present

## 2023-05-01 DIAGNOSIS — L858 Other specified epidermal thickening: Secondary | ICD-10-CM | POA: Diagnosis not present

## 2023-05-01 DIAGNOSIS — L739 Follicular disorder, unspecified: Secondary | ICD-10-CM | POA: Diagnosis not present

## 2023-05-01 DIAGNOSIS — L814 Other melanin hyperpigmentation: Secondary | ICD-10-CM | POA: Diagnosis not present

## 2023-06-26 DIAGNOSIS — K011 Impacted teeth: Secondary | ICD-10-CM | POA: Diagnosis not present

## 2023-10-17 ENCOUNTER — Encounter: Payer: Self-pay | Admitting: Sports Medicine
